# Patient Record
Sex: Female | Born: 1960 | Race: White | Hispanic: No | Marital: Married | Smoking: Never smoker
Health system: Southern US, Community
[De-identification: ages and names within clinical notes are randomized; demographics above are authoritative.]

---

## 2018-07-01 ENCOUNTER — Inpatient Hospital Stay (HOSPITAL_COMMUNITY): Payer: 59

## 2018-07-01 ENCOUNTER — Inpatient Hospital Stay (HOSPITAL_COMMUNITY): Payer: 59 | Admitting: Certified Registered Nurse Anesthetist

## 2018-07-01 ENCOUNTER — Inpatient Hospital Stay (HOSPITAL_BASED_OUTPATIENT_CLINIC_OR_DEPARTMENT_OTHER)
Admission: EM | Admit: 2018-07-01 | Discharge: 2018-07-04 | DRG: 470 | Disposition: A | Discharge status: Home Health | Payer: 59 | Attending: Internal Medicine | Admitting: Internal Medicine

## 2018-07-01 ENCOUNTER — Encounter (HOSPITAL_COMMUNITY)
Admission: EM | Disposition: A | Discharge status: Home Health | Payer: Self-pay | Source: Home / Self Care | Attending: Internal Medicine

## 2018-07-01 ENCOUNTER — Encounter (HOSPITAL_BASED_OUTPATIENT_CLINIC_OR_DEPARTMENT_OTHER): Payer: Self-pay | Admitting: *Deleted

## 2018-07-01 ENCOUNTER — Other Ambulatory Visit: Payer: Self-pay | Admitting: Physician Assistant

## 2018-07-01 ENCOUNTER — Other Ambulatory Visit: Payer: Self-pay

## 2018-07-01 ENCOUNTER — Emergency Department (HOSPITAL_BASED_OUTPATIENT_CLINIC_OR_DEPARTMENT_OTHER): Payer: 59

## 2018-07-01 DIAGNOSIS — Y9389 Activity, other specified: Secondary | ICD-10-CM | POA: Diagnosis not present

## 2018-07-01 DIAGNOSIS — W010XXA Fall on same level from slipping, tripping and stumbling without subsequent striking against object, initial encounter: Secondary | ICD-10-CM | POA: Diagnosis present

## 2018-07-01 DIAGNOSIS — Z884 Allergy status to anesthetic agent status: Secondary | ICD-10-CM | POA: Diagnosis not present

## 2018-07-01 DIAGNOSIS — Z96641 Presence of right artificial hip joint: Secondary | ICD-10-CM | POA: Diagnosis not present

## 2018-07-01 DIAGNOSIS — D72828 Other elevated white blood cell count: Secondary | ICD-10-CM | POA: Diagnosis not present

## 2018-07-01 DIAGNOSIS — S72001P Fracture of unspecified part of neck of right femur, subsequent encounter for closed fracture with malunion: Secondary | ICD-10-CM | POA: Diagnosis not present

## 2018-07-01 DIAGNOSIS — Y9289 Other specified places as the place of occurrence of the external cause: Secondary | ICD-10-CM | POA: Diagnosis not present

## 2018-07-01 DIAGNOSIS — S80211A Abrasion, right knee, initial encounter: Secondary | ICD-10-CM | POA: Diagnosis present

## 2018-07-01 DIAGNOSIS — Z20828 Contact with and (suspected) exposure to other viral communicable diseases: Secondary | ICD-10-CM | POA: Diagnosis present

## 2018-07-01 DIAGNOSIS — S72011A Unspecified intracapsular fracture of right femur, initial encounter for closed fracture: Secondary | ICD-10-CM | POA: Diagnosis present

## 2018-07-01 DIAGNOSIS — R110 Nausea: Secondary | ICD-10-CM | POA: Diagnosis not present

## 2018-07-01 DIAGNOSIS — Z885 Allergy status to narcotic agent status: Secondary | ICD-10-CM | POA: Diagnosis not present

## 2018-07-01 DIAGNOSIS — R300 Dysuria: Secondary | ICD-10-CM | POA: Diagnosis not present

## 2018-07-01 DIAGNOSIS — D509 Iron deficiency anemia, unspecified: Secondary | ICD-10-CM | POA: Diagnosis present

## 2018-07-01 DIAGNOSIS — S80212A Abrasion, left knee, initial encounter: Secondary | ICD-10-CM | POA: Diagnosis present

## 2018-07-01 DIAGNOSIS — Z419 Encounter for procedure for purposes other than remedying health state, unspecified: Secondary | ICD-10-CM

## 2018-07-01 DIAGNOSIS — R42 Dizziness and giddiness: Secondary | ICD-10-CM | POA: Diagnosis not present

## 2018-07-01 DIAGNOSIS — M25551 Pain in right hip: Secondary | ICD-10-CM | POA: Diagnosis present

## 2018-07-01 DIAGNOSIS — S72001A Fracture of unspecified part of neck of right femur, initial encounter for closed fracture: Secondary | ICD-10-CM | POA: Diagnosis not present

## 2018-07-01 DIAGNOSIS — W19XXXA Unspecified fall, initial encounter: Secondary | ICD-10-CM

## 2018-07-01 DIAGNOSIS — D62 Acute posthemorrhagic anemia: Secondary | ICD-10-CM | POA: Diagnosis not present

## 2018-07-01 HISTORY — PX: TOTAL HIP ARTHROPLASTY: SHX124

## 2018-07-01 LAB — CBC WITH DIFFERENTIAL/PLATELET
Abs Immature Granulocytes: 0.03 10*3/uL (ref 0.00–0.07)
Basophils Absolute: 0 10*3/uL (ref 0.0–0.1)
Basophils Relative: 0 %
Eosinophils Absolute: 0 10*3/uL (ref 0.0–0.5)
Eosinophils Relative: 0 %
HCT: 38.1 % (ref 36.0–46.0)
Hemoglobin: 12.1 g/dL (ref 12.0–15.0)
Immature Granulocytes: 0 %
Lymphocytes Relative: 3 %
Lymphs Abs: 0.3 10*3/uL — ABNORMAL LOW (ref 0.7–4.0)
MCH: 29.9 pg (ref 26.0–34.0)
MCHC: 31.8 g/dL (ref 30.0–36.0)
MCV: 94.1 fL (ref 80.0–100.0)
Monocytes Absolute: 0.7 10*3/uL (ref 0.1–1.0)
Monocytes Relative: 7 %
Neutro Abs: 8.8 10*3/uL — ABNORMAL HIGH (ref 1.7–7.7)
Neutrophils Relative %: 90 %
Platelets: 199 10*3/uL (ref 150–400)
RBC: 4.05 MIL/uL (ref 3.87–5.11)
RDW: 12.8 % (ref 11.5–15.5)
WBC: 9.9 10*3/uL (ref 4.0–10.5)
nRBC: 0 % (ref 0.0–0.2)

## 2018-07-01 LAB — BASIC METABOLIC PANEL
Anion gap: 11 (ref 5–15)
BUN: 16 mg/dL (ref 6–20)
CO2: 23 mmol/L (ref 22–32)
Calcium: 9.1 mg/dL (ref 8.9–10.3)
Chloride: 105 mmol/L (ref 98–111)
Creatinine, Ser: 0.63 mg/dL (ref 0.44–1.00)
GFR calc Af Amer: >60 mL/min (ref >60)
GFR calc non Af Amer: >60 mL/min (ref >60)
Glucose, Bld: 111 mg/dL — ABNORMAL HIGH (ref 70–99)
Potassium: 3.9 mmol/L (ref 3.5–5.1)
Sodium: 139 mmol/L (ref 135–145)

## 2018-07-01 LAB — ABO/RH: ABO/RH(D): A POS

## 2018-07-01 LAB — SURGICAL PCR SCREEN
MRSA, PCR: NEGATIVE
Staphylococcus aureus: NEGATIVE

## 2018-07-01 LAB — SARS CORONAVIRUS 2 BY RT PCR (HOSPITAL ORDER, PERFORMED IN ~~LOC~~ HOSPITAL LAB): SARS Coronavirus 2: NEGATIVE

## 2018-07-01 LAB — TYPE AND SCREEN
ABO/RH(D): A POS
Antibody Screen: NEGATIVE

## 2018-07-01 LAB — SARS CORONAVIRUS 2 AG (30 MIN TAT): SARS Coronavirus 2 Ag: NEGATIVE

## 2018-07-01 SURGERY — ARTHROPLASTY, HIP, TOTAL, ANTERIOR APPROACH
Anesthesia: Spinal | Laterality: Right

## 2018-07-01 MED ORDER — MIDAZOLAM HCL 2 MG/2ML IJ SOLN
INTRAMUSCULAR | Status: AC
  Filled 2018-07-01: qty 2

## 2018-07-01 MED ORDER — PROPOFOL 10 MG/ML IV BOLUS
INTRAVENOUS | Status: AC
  Filled 2018-07-01: qty 20

## 2018-07-01 MED ORDER — DOCUSATE SODIUM 100 MG PO CAPS
100.0000 mg | ORAL_CAPSULE | Freq: Two times a day (BID) | ORAL | Status: DC
  Administered 2018-07-02 – 2018-07-03 (×5): 100 mg via ORAL
  Filled 2018-07-01 (×6): qty 1

## 2018-07-01 MED ORDER — FENTANYL CITRATE (PF) 100 MCG/2ML IJ SOLN
50.0000 ug | Freq: Once | INTRAMUSCULAR | Status: DC
  Filled 2018-07-01: qty 2

## 2018-07-01 MED ORDER — ONDANSETRON HCL 4 MG/2ML IJ SOLN: 4.0000 mg | Freq: Four times a day (QID) | INTRAMUSCULAR | Status: DC | PRN

## 2018-07-01 MED ORDER — OXYCODONE HCL 5 MG/5ML PO SOLN: 5.0000 mg | Freq: Once | ORAL | Status: DC | PRN

## 2018-07-01 MED ORDER — POVIDONE-IODINE 10 % EX SWAB: 2.0000 | Freq: Once | CUTANEOUS | Status: DC

## 2018-07-01 MED ORDER — ONDANSETRON HCL 4 MG/2ML IJ SOLN
4.0000 mg | Freq: Once | INTRAMUSCULAR | Status: AC
  Administered 2018-07-01: 4 mg via INTRAVENOUS
  Filled 2018-07-01: qty 2

## 2018-07-01 MED ORDER — ONDANSETRON HCL 4 MG/2ML IJ SOLN: 4.0000 mg | Freq: Once | INTRAMUSCULAR | Status: DC | PRN

## 2018-07-01 MED ORDER — ONDANSETRON HCL 4 MG/2ML IJ SOLN
INTRAMUSCULAR | Status: DC | PRN
  Administered 2018-07-01: 4 mg via INTRAVENOUS

## 2018-07-01 MED ORDER — POVIDONE-IODINE 10 % EX SWAB
2.0000 "application " | Freq: Once | CUTANEOUS | Status: AC
  Administered 2018-07-01: 2 via TOPICAL

## 2018-07-01 MED ORDER — ONDANSETRON HCL 4 MG PO TABS: 4.0000 mg | ORAL_TABLET | Freq: Four times a day (QID) | ORAL | Status: DC | PRN

## 2018-07-01 MED ORDER — METHOCARBAMOL 500 MG PO TABS
500.0000 mg | ORAL_TABLET | Freq: Four times a day (QID) | ORAL | Status: DC | PRN
  Administered 2018-07-02 – 2018-07-03 (×3): 500 mg via ORAL
  Filled 2018-07-01 (×3): qty 1

## 2018-07-01 MED ORDER — ALUM & MAG HYDROXIDE-SIMETH 200-200-20 MG/5ML PO SUSP: 30.0000 mL | ORAL | Status: DC | PRN

## 2018-07-01 MED ORDER — CEFAZOLIN SODIUM-DEXTROSE 2-4 GM/100ML-% IV SOLN
2.0000 g | INTRAVENOUS | Status: AC
  Administered 2018-07-01: 2 g via INTRAVENOUS
  Filled 2018-07-01: qty 100

## 2018-07-01 MED ORDER — METOCLOPRAMIDE HCL 5 MG PO TABS
5.0000 mg | ORAL_TABLET | Freq: Three times a day (TID) | ORAL | Status: DC | PRN
  Filled 2018-07-01: qty 2

## 2018-07-01 MED ORDER — DIPHENHYDRAMINE HCL 12.5 MG/5ML PO ELIX: 12.5000 mg | ORAL_SOLUTION | ORAL | Status: DC | PRN

## 2018-07-01 MED ORDER — KETOROLAC TROMETHAMINE 15 MG/ML IJ SOLN
15.0000 mg | Freq: Four times a day (QID) | INTRAMUSCULAR | Status: DC | PRN
  Administered 2018-07-01 – 2018-07-04 (×2): 15 mg via INTRAVENOUS
  Filled 2018-07-01 (×3): qty 1

## 2018-07-01 MED ORDER — ONDANSETRON 4 MG PO TBDP
4.0000 mg | ORAL_TABLET | Freq: Once | ORAL | Status: DC
  Filled 2018-07-01: qty 1

## 2018-07-01 MED ORDER — TRANEXAMIC ACID-NACL 1000-0.7 MG/100ML-% IV SOLN
1000.0000 mg | INTRAVENOUS | Status: AC
  Administered 2018-07-01: 1000 mg via INTRAVENOUS
  Filled 2018-07-01: qty 100

## 2018-07-01 MED ORDER — PROPOFOL 10 MG/ML IV BOLUS
INTRAVENOUS | Status: AC
  Filled 2018-07-01: qty 40

## 2018-07-01 MED ORDER — CHLORHEXIDINE GLUCONATE 4 % EX LIQD: 60.0000 mL | Freq: Once | CUTANEOUS | Status: DC

## 2018-07-01 MED ORDER — METHOCARBAMOL 1000 MG/10ML IJ SOLN
500.0000 mg | Freq: Four times a day (QID) | INTRAVENOUS | Status: DC | PRN
  Filled 2018-07-01: qty 5

## 2018-07-01 MED ORDER — SODIUM CHLORIDE 0.9 % IR SOLN
Status: DC | PRN
  Administered 2018-07-01: 1000 mL

## 2018-07-01 MED ORDER — TRAMADOL HCL 50 MG PO TABS
100.0000 mg | ORAL_TABLET | Freq: Four times a day (QID) | ORAL | Status: DC | PRN
  Administered 2018-07-02: 100 mg via ORAL
  Filled 2018-07-01: qty 2

## 2018-07-01 MED ORDER — DEXAMETHASONE SODIUM PHOSPHATE 10 MG/ML IJ SOLN
INTRAMUSCULAR | Status: AC
  Filled 2018-07-01: qty 1

## 2018-07-01 MED ORDER — METOCLOPRAMIDE HCL 5 MG/ML IJ SOLN: 5.0000 mg | Freq: Three times a day (TID) | INTRAMUSCULAR | Status: DC | PRN

## 2018-07-01 MED ORDER — FENTANYL CITRATE (PF) 100 MCG/2ML IJ SOLN: 25.0000 ug | INTRAMUSCULAR | Status: DC | PRN

## 2018-07-01 MED ORDER — OXYCODONE HCL 5 MG PO TABS
5.0000 mg | ORAL_TABLET | ORAL | Status: DC | PRN
  Filled 2018-07-01: qty 1

## 2018-07-01 MED ORDER — DEXAMETHASONE SODIUM PHOSPHATE 10 MG/ML IJ SOLN
INTRAMUSCULAR | Status: DC | PRN
  Administered 2018-07-01: 5 mg via INTRAVENOUS

## 2018-07-01 MED ORDER — LACTATED RINGERS IV SOLN
INTRAVENOUS | Status: DC
  Administered 2018-07-01 (×2): via INTRAVENOUS

## 2018-07-01 MED ORDER — OXYCODONE HCL 5 MG PO TABS: 5.0000 mg | ORAL_TABLET | Freq: Once | ORAL | Status: DC | PRN

## 2018-07-01 MED ORDER — ASPIRIN 81 MG PO CHEW
81.0000 mg | CHEWABLE_TABLET | Freq: Two times a day (BID) | ORAL | Status: DC
  Administered 2018-07-02 – 2018-07-04 (×6): 81 mg via ORAL
  Filled 2018-07-01 (×6): qty 1

## 2018-07-01 MED ORDER — PHENOL 1.4 % MT LIQD: 1.0000 | OROMUCOSAL | Status: DC | PRN

## 2018-07-01 MED ORDER — METHOCARBAMOL 500 MG IVPB - SIMPLE MED
INTRAVENOUS | Status: AC
  Administered 2018-07-01: 500 mg
  Filled 2018-07-01: qty 50

## 2018-07-01 MED ORDER — ENOXAPARIN SODIUM 40 MG/0.4ML ~~LOC~~ SOLN: 40.0000 mg | SUBCUTANEOUS | Status: DC

## 2018-07-01 MED ORDER — POLYETHYLENE GLYCOL 3350 17 G PO PACK: 17.0000 g | PACK | Freq: Every day | ORAL | Status: DC | PRN

## 2018-07-01 MED ORDER — ONDANSETRON HCL 4 MG/2ML IJ SOLN
INTRAMUSCULAR | Status: AC
  Filled 2018-07-01: qty 2

## 2018-07-01 MED ORDER — ACETAMINOPHEN 325 MG PO TABS
325.0000 mg | ORAL_TABLET | Freq: Four times a day (QID) | ORAL | Status: DC | PRN
  Administered 2018-07-04: 650 mg via ORAL
  Filled 2018-07-01: qty 2

## 2018-07-01 MED ORDER — MIDAZOLAM HCL 5 MG/5ML IJ SOLN
INTRAMUSCULAR | Status: DC | PRN
  Administered 2018-07-01: 2 mg via INTRAVENOUS

## 2018-07-01 MED ORDER — PROPOFOL 10 MG/ML IV BOLUS
INTRAVENOUS | Status: DC | PRN
  Administered 2018-07-01: 20 mg via INTRAVENOUS
  Administered 2018-07-01: 10 mg via INTRAVENOUS
  Administered 2018-07-01: 20 mg via INTRAVENOUS

## 2018-07-01 MED ORDER — FENTANYL CITRATE (PF) 100 MCG/2ML IJ SOLN: 100.0000 ug | Freq: Once | INTRAMUSCULAR | Status: DC

## 2018-07-01 MED ORDER — CEFAZOLIN SODIUM-DEXTROSE 1-4 GM/50ML-% IV SOLN
1.0000 g | Freq: Four times a day (QID) | INTRAVENOUS | Status: AC
  Administered 2018-07-02 (×2): 1 g via INTRAVENOUS
  Filled 2018-07-01 (×2): qty 50

## 2018-07-01 MED ORDER — GABAPENTIN 100 MG PO CAPS
100.0000 mg | ORAL_CAPSULE | Freq: Three times a day (TID) | ORAL | Status: DC
  Administered 2018-07-02 – 2018-07-04 (×8): 100 mg via ORAL
  Filled 2018-07-01 (×8): qty 1

## 2018-07-01 MED ORDER — OXYCODONE HCL 5 MG PO TABS: 10.0000 mg | ORAL_TABLET | ORAL | Status: DC | PRN

## 2018-07-01 MED ORDER — PROPOFOL 500 MG/50ML IV EMUL
INTRAVENOUS | Status: DC | PRN
  Administered 2018-07-01: 75 ug/kg/min via INTRAVENOUS

## 2018-07-01 MED ORDER — SODIUM CHLORIDE 0.9 % IV SOLN
INTRAVENOUS | Status: DC
  Administered 2018-07-01: 18:00:00 via INTRAVENOUS

## 2018-07-01 MED ORDER — BUPIVACAINE HCL (PF) 0.5 % IJ SOLN
INTRAMUSCULAR | Status: DC | PRN
  Administered 2018-07-01: 3 mL via INTRATHECAL

## 2018-07-01 MED ORDER — MENTHOL 3 MG MT LOZG: 1.0000 | LOZENGE | OROMUCOSAL | Status: DC | PRN

## 2018-07-01 MED ORDER — FENTANYL CITRATE (PF) 100 MCG/2ML IJ SOLN
INTRAMUSCULAR | Status: AC
  Filled 2018-07-01: qty 2

## 2018-07-01 MED ORDER — HYDROMORPHONE HCL 1 MG/ML IJ SOLN: 0.5000 mg | INTRAMUSCULAR | Status: DC | PRN

## 2018-07-01 MED ORDER — PANTOPRAZOLE SODIUM 40 MG PO TBEC
40.0000 mg | DELAYED_RELEASE_TABLET | Freq: Every day | ORAL | Status: DC
  Administered 2018-07-02 – 2018-07-03 (×2): 40 mg via ORAL
  Filled 2018-07-01 (×3): qty 1

## 2018-07-01 MED ORDER — SODIUM CHLORIDE 0.9 % IV SOLN
INTRAVENOUS | Status: DC
  Administered 2018-07-02: via INTRAVENOUS

## 2018-07-01 SURGICAL SUPPLY — 40 items
BAG ZIPLOCK 12X15 (MISCELLANEOUS) IMPLANT
BENZOIN TINCTURE PRP APPL 2/3 (GAUZE/BANDAGES/DRESSINGS) ×3 IMPLANT
BLADE SAW SGTL 18X1.27X75 (BLADE) ×2 IMPLANT
BLADE SAW SGTL 18X1.27X75MM (BLADE) ×1
BLADE SURG SZ10 CARB STEEL (BLADE) ×6 IMPLANT
CLOSURE WOUND 1/2 X4 (GAUZE/BANDAGES/DRESSINGS) ×1
COVER PERINEAL POST (MISCELLANEOUS) ×3 IMPLANT
COVER SURGICAL LIGHT HANDLE (MISCELLANEOUS) ×3 IMPLANT
COVER WAND RF STERILE (DRAPES) IMPLANT
CUP SECTOR GRIPTON 50MM (Cup) ×3 IMPLANT
DRAPE STERI IOBAN 125X83 (DRAPES) ×3 IMPLANT
DRAPE U-SHAPE 47X51 STRL (DRAPES) ×6 IMPLANT
DRSG AQUACEL AG ADV 3.5X10 (GAUZE/BANDAGES/DRESSINGS) ×3 IMPLANT
DURAPREP 26ML APPLICATOR (WOUND CARE) ×3 IMPLANT
ELECT REM PT RETURN 15FT ADLT (MISCELLANEOUS) ×3 IMPLANT
GAUZE XEROFORM 1X8 LF (GAUZE/BANDAGES/DRESSINGS) IMPLANT
GLOVE BIO SURGEON STRL SZ7.5 (GLOVE) ×3 IMPLANT
GLOVE BIOGEL PI IND STRL 8 (GLOVE) ×2 IMPLANT
GLOVE BIOGEL PI INDICATOR 8 (GLOVE) ×4
GLOVE ECLIPSE 8.0 STRL XLNG CF (GLOVE) ×3 IMPLANT
GOWN STRL REUS W/TWL XL LVL3 (GOWN DISPOSABLE) ×6 IMPLANT
HANDPIECE INTERPULSE COAX TIP (DISPOSABLE) ×2
HEAD FEM STD 32X+9 STRL (Hips) ×3 IMPLANT
HEAD FEMORAL 32 CERAMIC (Hips) ×3 IMPLANT
HOLDER FOLEY CATH W/STRAP (MISCELLANEOUS) ×3 IMPLANT
KIT TURNOVER KIT A (KITS) ×3 IMPLANT
LINER ACETABULAR 32X50 (Liner) ×3 IMPLANT
PACK ANTERIOR HIP CUSTOM (KITS) ×3 IMPLANT
SCREW 6.5MMX25MM (Screw) ×3 IMPLANT
SET HNDPC FAN SPRY TIP SCT (DISPOSABLE) ×1 IMPLANT
STEM FEMORAL SZ 6MM STD ACTIS (Stem) ×3 IMPLANT
STRIP CLOSURE SKIN 1/2X4 (GAUZE/BANDAGES/DRESSINGS) ×2 IMPLANT
SUT ETHIBOND NAB CT1 #1 30IN (SUTURE) ×3 IMPLANT
SUT MNCRL AB 4-0 PS2 18 (SUTURE) IMPLANT
SUT VIC AB 0 CT1 36 (SUTURE) ×3 IMPLANT
SUT VIC AB 1 CT1 36 (SUTURE) ×3 IMPLANT
SUT VIC AB 2-0 CT1 27 (SUTURE) ×4
SUT VIC AB 2-0 CT1 TAPERPNT 27 (SUTURE) ×2 IMPLANT
TRAY FOLEY MTR SLVR 16FR STAT (SET/KITS/TRAYS/PACK) ×3 IMPLANT
YANKAUER SUCT BULB TIP 10FT TU (MISCELLANEOUS) ×3 IMPLANT

## 2018-07-01 NOTE — Brief Op Note (Signed)
07/01/2018  8:15 PM  PATIENT:  Annette Boyd  58 y.o. female  PRE-OPERATIVE DIAGNOSIS:  RIGHT FEMORAL NECK FRACTURE  POST-OPERATIVE DIAGNOSIS:  RIGHT FEMORAL NECK FRACTURE  PROCEDURE:  Procedure(s): TOTAL HIP ARTHROPLASTY ANTERIOR APPROACH (Right)  SURGEON:  Surgeon(s) and Role:    Kathryne Hitch, MD - Primary  PHYSICIAN ASSISTANT: Rexene Edison, PA-C  ANESTHESIA:   spinal  EBL:  200 mL   COUNTS:  YES  DICTATION: .Other Dictation: Dictation Number [id number]  PLAN OF CARE: Admit to inpatient   PATIENT DISPOSITION:  PACU - hemodynamically stable.   Delay start of Pharmacological VTE agent (>24hrs) due to surgical blood loss or risk of bleeding: no

## 2018-07-01 NOTE — ED Notes (Signed)
ED Provider at bedside. 

## 2018-07-01 NOTE — Anesthesia Procedure Notes (Signed)
Spinal  Patient location during procedure: OR Staffing Anesthesiologist: Lucretia Kern, MD Performed: anesthesiologist  Preanesthetic Checklist Completed: patient identified, surgical consent, pre-op evaluation, timeout performed, IV checked, risks and benefits discussed and monitors and equipment checked Spinal Block Patient position: left lateral decubitus Prep: site prepped and draped and DuraPrep Patient monitoring: continuous pulse ox, blood pressure and heart rate Approach: midline Location: L3-4 Injection technique: single-shot Needle Needle type: Pencan  Needle gauge: 24 G Needle length: 9 cm Additional Notes Functioning IV was confirmed and monitors were applied. Sterile prep and drape, including hand hygiene and sterile gloves were used. The patient was positioned and the spine was prepped. The skin was anesthetized with lidocaine.  Free flow of clear CSF was obtained prior to injecting local anesthetic into the CSF. The needle was carefully withdrawn. The patient tolerated the procedure well.

## 2018-07-01 NOTE — ED Notes (Signed)
Mardella Layman, RN from [employer].  Report given to her.  Will update patient.

## 2018-07-01 NOTE — Transfer of Care (Signed)
Immediate Anesthesia Transfer of Care Note  Patient: Annette Boyd  Procedure(s) Performed: TOTAL HIP ARTHROPLASTY ANTERIOR APPROACH (Right )  Patient Location: PACU  Anesthesia Type:Spinal  Level of Consciousness: drowsy  Airway & Oxygen Therapy: Patient Spontanous Breathing and Patient connected to face mask  Post-op Assessment: Report given to RN and Post -op Vital signs reviewed and stable  Post vital signs: Reviewed and stable  Last Vitals:  Vitals Value Taken Time  BP 119/67 07/01/2018  8:36 PM  Temp    Pulse    Resp 12 07/01/2018  8:37 PM  SpO2    Vitals shown include unvalidated device data.  Last Pain:  Vitals:   07/01/18 1815  TempSrc:   PainSc: 7       Patients Stated Pain Goal: 5 (07/01/18 1815)  Complications: No apparent anesthesia complications

## 2018-07-01 NOTE — ED Notes (Signed)
Husband, Annette Boyd is made of patient's room assignment and the hospital that she will be transferred to.  Attempted to call report, accepting nurse will call me back.

## 2018-07-01 NOTE — Anesthesia Postprocedure Evaluation (Signed)
Anesthesia Post Note  Patient: Annette Boyd  Procedure(s) Performed: TOTAL HIP ARTHROPLASTY ANTERIOR APPROACH (Right )     Patient location during evaluation: PACU Anesthesia Type: Spinal Level of consciousness: oriented and awake and alert Pain management: pain level controlled Vital Signs Assessment: post-procedure vital signs reviewed and stable Respiratory status: spontaneous breathing, respiratory function stable and nonlabored ventilation Cardiovascular status: blood pressure returned to baseline and stable Postop Assessment: no headache, no backache, no apparent nausea or vomiting and spinal receding Anesthetic complications: no    Last Vitals:  Vitals:   07/01/18 2115 07/01/18 2130  BP: (!) 145/76 (!) 150/78  Pulse: 77 74  Resp: (!) 8 14  Temp:    SpO2: 100% 100%    Last Pain:  Vitals:   07/01/18 2130  TempSrc:   PainSc: 0-No pain                 Lucretia Kern

## 2018-07-01 NOTE — H&P (Signed)
History and Physical    Lincoln Shew OVP:[id number] DOB: [birth year] DOA: 07/01/2018  PCP: Patient, No Pcp Per Patient coming from: Home  Chief Complaint: Right hip pain  HPI: Annette Boyd is a 58 y.o. female with no medical history. Around 4:00PM on 5/26, patient reports tripping over the door landing with furnature in her hands and fell onto her right hip. Patient took 45 minutes for her to get up and at that time declined transfer to the hospital. Unfortunately, pain worsened overnight and she was not able to ambulate secondary to significant pain.  ED Course: Vitals: Afebrile, normal pulse, normal respirations, slightly elevated BP of 146/69, on room air Labs: Glucose of 111 Imaging: Chest x-ray unremarkable Medications/Course: Zofran given  Review of Systems: Review of Systems  Musculoskeletal: Positive for joint pain (right hip).  All other systems reviewed and are negative.   History reviewed. No pertinent past medical history.  Past Surgical History:  Procedure Laterality Date  . CESAREAN SECTION     x 3     reports that she has never smoked. She has never used smokeless tobacco. She reports current alcohol use of about 1.0 standard drinks of alcohol per week. She reports that she does not use drugs.  Allergies  Allergen Reactions  . Anesthesia S-I-60 Nausea And Vomiting    Vomits for several hours.  Any Opiates makes her vomits  . Morphine And Related Nausea And Vomiting    Family History  Problem Relation Age of Onset  . Heart disease Neg Hx   . Lung disease Neg Hx    Prior to Admission medications   Medication Sig Start Date End Date Taking? Authorizing Provider  Multiple Vitamins-Minerals (MULTIVITAMIN WITH MINERALS) tablet Take 1 tablet by mouth daily.   Yes [provider]    Physical Exam:  Physical Exam Constitutional:      General: She is not in acute distress.    Appearance: She is well-developed. She is not diaphoretic.  Eyes:   Conjunctiva/sclera: Conjunctivae normal.     Pupils: Pupils are equal, round, and reactive to light.  Neck:     Musculoskeletal: Normal range of motion.  Cardiovascular:     Rate and Rhythm: Normal rate and regular rhythm.     Heart sounds: Normal heart sounds. No murmur.  Pulmonary:     Effort: Pulmonary effort is normal. No respiratory distress.     Breath sounds: Normal breath sounds. No wheezing or rales.  Abdominal:     General: Bowel sounds are normal. There is no distension.     Palpations: Abdomen is soft.     Tenderness: There is no abdominal tenderness. There is no guarding or rebound.  Musculoskeletal:        General: No tenderness.     Right hip: She exhibits decreased range of motion.  Lymphadenopathy:     Cervical: No cervical adenopathy.  Skin:    General: Skin is warm and dry.  Neurological:     Mental Status: She is alert and oriented to person, place, and time.      Labs on Admission: I have personally reviewed following labs and imaging studies  CBC: Recent Labs  Lab 07/01/18 1311  WBC 9.9  NEUTROABS 8.8*  HGB 12.1  HCT 38.1  MCV 94.1  PLT 199    Basic Metabolic Panel: Recent Labs  Lab 07/01/18 1311  NA 139  K 3.9  CL 105  CO2 23  GLUCOSE 111*  BUN 16  CREATININE 0.63  CALCIUM 9.1    GFR: Estimated Creatinine Clearance: 70 mL/min (by C-G formula based on SCr of 0.63 mg/dL).  Radiological Exams on Admission: Dg Chest 1 View  Result Date: 07/01/2018 CLINICAL DATA:  Right hip fracture. EXAM: CHEST  1 VIEW COMPARISON:  None. FINDINGS: The heart size and mediastinal contours are within normal limits. Both lungs are clear. The visualized skeletal structures are unremarkable. IMPRESSION: No active disease. Electronically Signed   By: Lupita RaiderJames  Green Jr M.D.   On: 07/01/2018 13:01   Dg Hip Unilat W Or Wo Pelvis 2-3 Views Right  Result Date: 07/01/2018 CLINICAL DATA:  Right hip pain after fall yesterday. EXAM: DG HIP (WITH OR WITHOUT PELVIS)  2-3V RIGHT COMPARISON:  None. FINDINGS: Moderately displaced fracture is seen involving the proximal right femoral neck. No significant degenerative changes noted. Left hip appears normal. IMPRESSION: Moderately displaced proximal right femoral neck fracture. Electronically Signed   By: Lupita RaiderJames  Green Jr M.D.   On: 07/01/2018 13:01    EKG: Independently reviewed. Normal sinus rhythm. Likely right atrial enlargement. Non-specific t-wave changes in leads 1 and aVL.  Assessment/Plan Active Problems:   Closed right hip fracture (HCC)   Closed right hip fracture Secondary to fall after tripping. Orthopedic surgery planning for surgery this evening. Low risk for cardiac complications. -Toradol prn -NPO  DVT prophylaxis: Lovenox Code Status: Full code Family Communication: None Disposition Plan: Home vs SNF pending PT and orthopedic surgery recommendation after surgery Consults called: Orthopedic surgery Admission status: Inpatient   Jacquelin Hawkingalph Brendi Mccarroll, MD [facility] 07/01/2018, 4:04 PM  If 7PM-7AM, please contact night-coverage [email or web address] Password [email or web address]

## 2018-07-01 NOTE — Progress Notes (Signed)
Patient ID: Annette Boyd, female   DOB: 02/03/1961, 58 y.o.   MRN: [id number] I was called about this patient being the orthopedic surgeon on call.  I did review the films and it does show that she has a significant right hip femoral neck fracture with displacement.  The recommended treatment for this type of injury would be a total hip arthroplasty (replacement).  This will be done through direct anterior approach.  Per report, she is an otherwise healthy 58 year old female and this occurred her mechanical fall.  Her labs are pending.  She will be sent to [facility] in anticipation of surgery potentially later today or this evening depending on OR availability.  It would be nice to have medical clearance for surgery but I am also fine with admitting her if there is any pushback.  I will hopefully see her when she is brought over to [facility].

## 2018-07-01 NOTE — ED Triage Notes (Signed)
Larey Seat on a concrete driveway yesterday while moving furniture.

## 2018-07-01 NOTE — ED Provider Notes (Addendum)
[facility] EMERGENCY DEPARTMENT Provider Note   CSN: [id number] Arrival date & time: 07/01/18  1153    History   Chief Complaint Chief Complaint  Patient presents with  . Hip pain    HPI Annette Boyd is a 58 y.o. female who presents for evaluation of right hip pain that began after mechanical fall that occurred yesterday.  Patient reports that she was moving furniture and states that her feet got tripped up, causing her to fall and land on her right hip.  She denies any preceding chest pain or dizziness.  She does report that she hit her head but states that she did not have any LOC.  She is not on blood thinners.  Patient states that she has not been able to ambulate or bear weight on the leg since the incident.  She states that she has been moving around in an office chair at home to get around.  Patient states she has been taking some of tramadol that her husband had previously with minimal improvement in pain.  Patient states that the pain continued today, prompting ED visit.  She states pain is worse with movement.  She denies any numbness/weakness of the leg.  Patient denies any vision changes, neck pain, back pain, chest pain, difficulty breathing, abdominal pain, nausea/vomiting.      The history is provided by the patient.    History reviewed. No pertinent past medical history.  Patient Active Problem List   Diagnosis Date Noted  . Closed right hip fracture (HCC) 07/01/2018    Past Surgical History:  Procedure Laterality Date  . CESAREAN SECTION     x 3     OB History    Gravida  3   Para      Term      Preterm      AB      Living        SAB      TAB      Ectopic      Multiple      Live Births               Home Medications    Prior to Admission medications   Not on File    Family History History reviewed. No pertinent family history.  Social History Social History   Tobacco Use  . Smoking status: Never Smoker  .  Smokeless tobacco: Never Used  Substance Use Topics  . Alcohol use: Yes    Alcohol/week: 1.0 standard drinks    Types: 1 Glasses of wine per week    Comment: per day  . Drug use: Never     Allergies   Anesthesia s-i-60   Review of Systems Review of Systems  Eyes: Negative for visual disturbance.  Respiratory: Negative for shortness of breath.   Cardiovascular: Negative for chest pain.  Gastrointestinal: Negative for abdominal pain, nausea and vomiting.  Musculoskeletal: Negative for back pain and neck pain.       Hip Pain  Neurological: Negative for weakness and numbness.  All other systems reviewed and are negative.    Physical Exam Updated Vital Signs BP (!) 166/82 (BP Location: Right Arm)   Pulse 86   Temp 98.6 F (37 C) (Oral)   Resp 16   Ht  (1.676 m)   Wt 68 kg   SpO2 98%   BMI 24.21 kg/m   Physical Exam Vitals signs and nursing note reviewed.  Constitutional:  Appearance: Normal appearance. She is well-developed.  HENT:     Head: Normocephalic and atraumatic.     Comments: No tenderness to palpation of skull. No deformities or crepitus noted. No open wounds, abrasions or lacerations.  Eyes:     General: Lids are normal.     Conjunctiva/sclera: Conjunctivae normal.     Pupils: Pupils are equal, round, and reactive to light.     Comments: PERRL. EOMs intact.   Neck:     Musculoskeletal: Full passive range of motion without pain.     Comments: Full flexion/extension and lateral movement of neck fully intact. No bony midline tenderness. No deformities or crepitus.  Cardiovascular:     Rate and Rhythm: Normal rate and regular rhythm.     Pulses: Normal pulses.          Radial pulses are 2+ on the right side and 2+ on the left side.       Dorsalis pedis pulses are 2+ on the right side and 2+ on the left side.     Heart sounds: Normal heart sounds. No murmur. No friction rub. No gallop.   Pulmonary:     Effort: Pulmonary effort is normal.      Breath sounds: Normal breath sounds.     Comments: Lungs clear to auscultation bilaterally.  Symmetric chest rise.  No wheezing, rales, rhonchi. Abdominal:     Palpations: Abdomen is soft. Abdomen is not rigid.     Tenderness: There is no abdominal tenderness. There is no guarding.     Comments: Abdomen is soft, non-distended, non-tender. No rigidity, No guarding. No peritoneal signs.  Musculoskeletal: Normal range of motion.     Comments: Tenderness palpation noted to the right inguinal fold.  No deformity or crepitus noted.  Extension intact but limited secondary to pain.  Limited flexion/internal and external rotation secondary to pain.  No tenderness palpation noted to right knee, right tib-fib, right ankle.  No tenderness palpation in left lower extremity.  Full range of motion of left lower extremity intact without any difficulty. No tenderness to palpation to bilateral shoulders, clavicles, elbows, and wrists. No deformities or crepitus noted. FROM of BUE without difficulty.   Skin:    General: Skin is warm and dry.     Capillary Refill: Capillary refill takes less than 2 seconds.     Comments: Good distal cap refill.  LLE is not dusky in appearance or cool to touch.  Neurological:     Mental Status: She is alert and oriented to person, place, and time.     Comments: Cranial nerves III-XII intact Follows commands, Moves all extremities  5/5 strength to BUE and LLE.  Limited motion of left lower extremity secondary to pain. Sensation intact throughout all major nerve distributions Normal coordination  No slurred speech. No facial droop.   Psychiatric:        Speech: Speech normal.      ED Treatments / Results  Labs (all labs ordered are listed, but only abnormal results are displayed) Labs Reviewed  BASIC METABOLIC PANEL - Abnormal; Notable for the following components:      Result Value   Glucose, Bld 111 (*)    All other components within normal limits  CBC WITH  DIFFERENTIAL/PLATELET - Abnormal; Notable for the following components:   Neutro Abs 8.8 (*)    Lymphs Abs 0.3 (*)    All other components within normal limits  SARS CORONAVIRUS 2 (HOSP ORDER, PERFORMED IN [facility]  VIA ABBOTT ID)    EKG EKG Interpretation  Date/Time:  Wednesday Jul 01 2018 13:58:45 EDT Ventricular Rate:  74 PR Interval:    QRS Duration: 103 QT Interval:  409 QTC Calculation: 454 R Axis:   87 Text Interpretation:  Sinus rhythm Right atrial enlargement Borderline right axis deviation Nonspecific T abnormalities, lateral leads No previous tracing Confirmed by Gwyneth SproutPlunkett, Whitney ([id number]) on 07/01/2018 2:01:44 PM   Radiology Dg Chest 1 View  Result Date: 07/01/2018 CLINICAL DATA:  Right hip fracture. EXAM: CHEST  1 VIEW COMPARISON:  None. FINDINGS: The heart size and mediastinal contours are within normal limits. Both lungs are clear. The visualized skeletal structures are unremarkable. IMPRESSION: No active disease. Electronically Signed   By: Lupita RaiderJames  Green Jr M.D.   On: 07/01/2018 13:01   Dg Hip Unilat W Or Wo Pelvis 2-3 Views Right  Result Date: 07/01/2018 CLINICAL DATA:  Right hip pain after fall yesterday. EXAM: DG HIP (WITH OR WITHOUT PELVIS) 2-3V RIGHT COMPARISON:  None. FINDINGS: Moderately displaced fracture is seen involving the proximal right femoral neck. No significant degenerative changes noted. Left hip appears normal. IMPRESSION: Moderately displaced proximal right femoral neck fracture. Electronically Signed   By: Lupita RaiderJames  Green Jr M.D.   On: 07/01/2018 13:01    Procedures Procedures (including critical care time)  Medications Ordered in ED Medications  ondansetron (ZOFRAN) injection 4 mg (4 mg Intravenous Given 07/01/18 1316)     Initial Impression / Assessment and Plan / ED Course  I have reviewed the triage vital signs and the nursing notes.  Pertinent labs & imaging results that were available during my care of the patient were reviewed by me  and considered in my medical decision making (see chart for details).        58 year old female who presents for evaluation of right hip pain after mechanical fall that occurred yesterday. No LOC.  Has not been able to ambulate or bear weight since the incident.  No numbness/weakness. Patient is afebrile, non-toxic appearing, sitting comfortably on examination table. Vital signs reviewed and stable. Patient is neurovascularly intact.  Patient with pain limited range of motion of right hip secondary to pain.  Concern for musculoskeletal injury versus fracture.  Plan for x-ray imaging.  No concern for head injury.  No indication for imaging of head.  XR shows moderately displaced right femoral neck fracture.  Updated patient on plan. She has not had anything to eat since yesterday. Will plan to consult orthopedics.   Discussed patient with Dr. Magnus IvanBlackman (Ortho). [facility] Plan to keep patient NPO.   CBC shows no evidence of anemia or leukocytosis. BMP is unremarkable.  Discussed patient with Dr. Caleb PoppNettey (hospitalist). Will accept patient for admission.   Portions of this note were generated with Scientist, clinical (histocompatibility and immunogenetics)Dragon dictation software. Dictation errors may occur despite best attempts at proofreading.    Final Clinical Impressions(s) / ED Diagnoses   Final diagnoses:  Displaced fracture of right femoral neck [facility](HCC)    ED Discharge Orders    None       Maxwell CaulLayden, Lindsey A, PA-C 07/01/18 1403    Maxwell CaulLayden, Lindsey A, PA-C 07/01/18 1404    Gwyneth SproutPlunkett, Whitney, MD 07/01/18 1406

## 2018-07-01 NOTE — Anesthesia Preprocedure Evaluation (Addendum)
Anesthesia Evaluation  Patient identified by MRN, date of birth, ID band Patient awake    Reviewed: Allergy & Precautions, NPO status , Patient's Chart, lab work & pertinent test results  History of Anesthesia Complications Negative for: history of anesthetic complications  Airway Mallampati: III  TM Distance: >3 FB Neck ROM: Full  Mouth opening: Limited Mouth Opening  Dental no notable dental hx.    Pulmonary neg pulmonary ROS,    Pulmonary exam normal        Cardiovascular negative cardio ROS Normal cardiovascular exam     Neuro/Psych negative neurological ROS  negative psych ROS   GI/Hepatic negative GI ROS, Neg liver ROS,   Endo/Other  negative endocrine ROS  Renal/GU negative Renal ROS  negative genitourinary   Musculoskeletal negative musculoskeletal ROS (+)   Abdominal   Peds  Hematology negative hematology ROS (+)   Anesthesia Other Findings   Reproductive/Obstetrics                            Anesthesia Physical Anesthesia Plan  ASA: I and emergent  Anesthesia Plan: Spinal   Post-op Pain Management:    Induction:   PONV Risk Score and Plan: 2 and Propofol infusion and Treatment may vary due to age or medical condition  Airway Management Planned: Natural Airway and Simple Face Mask  Additional Equipment: None  Intra-op Plan:   Post-operative Plan:   Informed Consent: I have reviewed the patients History and Physical, chart, labs and discussed the procedure including the risks, benefits and alternatives for the proposed anesthesia with the patient or authorized representative who has indicated his/her understanding and acceptance.       Plan Discussed with:   Anesthesia Plan Comments:        Anesthesia Quick Evaluation

## 2018-07-01 NOTE — Consult Note (Signed)
Reason for Consult:Right femoral neck fracture Referring Physician: [name] is an 58 y.o. female.  HPI: Patient 58 year old female who had a mechanical fall while moving some furniture yesterday.  She states she got tripped up on the furniture and fell on her right hip.  She is unable to bear weight on the leg.  She denies any loss of consciousness or dizziness.  She denies any other injury outside of some minor scrapes to both knees.  She reports she takes no chronic medications.  She actually does not do well with any type of medication and only able to take tramadol.  She also does not do well with NSAIDs due to GI upset.  She has had no history of DVT PE.  Last meal was 12:00 yesterday.  She has been N.P.O. since that time.  History reviewed. No pertinent past medical history.  Past Surgical History:  Procedure Laterality Date  . CESAREAN SECTION     x 3    History reviewed. No pertinent family history.  Social History:  reports that she has never smoked. She has never used smokeless tobacco. She reports current alcohol use of about 1.0 standard drinks of alcohol per week. She reports that she does not use drugs.  Allergies:  Allergies  Allergen Reactions  . Anesthesia S-I-60 Nausea And Vomiting    Vomits for several hours.  Any Opiates makes her vomits  . Morphine And Related Nausea And Vomiting    Medications: I have reviewed the patient's current medications.  Results for orders placed or performed during the hospital encounter of 07/01/18 (from the past 48 hour(s))  Basic metabolic panel     Status: Abnormal   Collection Time: 07/01/18  1:11 PM  Result Value Ref Range   Sodium 139 135 - 145 mmol/L   Potassium 3.9 3.5 - 5.1 mmol/L   Chloride 105 98 - 111 mmol/L   CO2 23 22 - 32 mmol/L   Glucose, Bld 111 (H) 70 - 99 mg/dL   BUN 16 6 - 20 mg/dL   Creatinine, Ser 9.32 0.44 - 1.00 mg/dL   Calcium 9.1 8.9 - 35.5 mg/dL   GFR calc non Af Amer >60 >60 mL/min   GFR  calc Af Amer >60 >60 mL/min   Anion gap 11 5 - 15    Comment: Performed at [facility], [address] [place], [place] [address]  CBC with Differential     Status: Abnormal   Collection Time: 07/01/18  1:11 PM  Result Value Ref Range   WBC 9.9 4.0 - 10.5 K/uL   RBC 4.05 3.87 - 5.11 MIL/uL   Hemoglobin 12.1 12.0 - 15.0 g/dL   HCT 25.4 27.0 - 62.3 %   MCV 94.1 80.0 - 100.0 fL   MCH 29.9 26.0 - 34.0 pg   MCHC 31.8 30.0 - 36.0 g/dL   RDW 76.2 83.1 - 51.7 %   Platelets 199 150 - 400 K/uL   nRBC 0.0 0.0 - 0.2 %   Neutrophils Relative % 90 %   Neutro Abs 8.8 (H) 1.7 - 7.7 K/uL   Lymphocytes Relative 3 %   Lymphs Abs 0.3 (L) 0.7 - 4.0 K/uL   Monocytes Relative 7 %   Monocytes Absolute 0.7 0.1 - 1.0 K/uL   Eosinophils Relative 0 %   Eosinophils Absolute 0.0 0.0 - 0.5 K/uL   Basophils Relative 0 %   Basophils Absolute 0.0 0.0 - 0.1 K/uL   Immature  Granulocytes 0 %   Abs Immature Granulocytes 0.03 0.00 - 0.07 K/uL    Comment: Performed at [facility] [place], [address] [place], [place] [address]  SARS Coronavirus 2 (Hosp order,Performed in [facility] lab via [employer])     Status: None   Collection Time: 07/01/18  1:40 PM  Result Value Ref Range   SARS Coronavirus 2 ([employer] Now) NEGATIVE NEGATIVE    Comment: (NOTE) Interpretive Result Comment(s): COVID 19 Positive SARS CoV 2 target nucleic acids are DETECTED. The SARS CoV 2 RNA is generally detectable in upper and lower respiratory specimens during the acute phase of infection.  Positive results are indicative of active infection with SARS CoV 2.  Clinical correlation with patient history and other diagnostic information is necessary to determine patient infection status.  Positive results do not rule out bacterial infection or coinfection with other viruses. The expected result is Negative. COVID 19 Negative SARS CoV 2 target nucleic acids are NOT DETECTED. The SARS CoV 2 RNA is generally detectable in  upper and lower respiratory specimens during the acute phase of infection.  Negative results do not preclude SARS CoV 2 infection, do not rule out coinfections with other pathogens, and should not be used as the sole basis for treatment or other patient management decisions.  Negative results must be combined with clinical  observations, patient history, and epidemiological information. The expected result is Negative. Invalid Presence or absence of SARS CoV 2 nucleic acids cannot be determined. Repeat testing was performed on the submitted specimen and repeated Invalid results were obtained.  If clinically indicated, additional testing on a new specimen with an alternate test methodology [phone] is advised.  The SARS CoV 2 RNA is generally detectable in upper and lower respiratory specimens during the acute phase of infection. The expected result is Negative. Fact Sheet for Patients:  http://www.graves-ford.org/https://www.fda.gov/media/136524/download Fact Sheet for Healthcare Providers: EnviroConcern.sihttps://www.fda.gov/media/136523/download This test is not yet approved or cleared by the [place] FDA and has been authorized for detection and/or diagnosis of SARS CoV 2 by FDA under an Emergency Use Authorization (EUA).  This EUA will remain in effect (meaning this test can be used) for the duration of the COVID19 d eclaration under Section 564(b)(1) of the Act, 21 U.S.C. section [phone] 3(b)(1), unless the authorization is terminated or revoked sooner. Performed at [facility] [place], [address] [place], [place] [address]     Dg Chest 1 View  Result Date: 07/01/2018 CLINICAL DATA:  Right hip fracture. EXAM: CHEST  1 VIEW COMPARISON:  None. FINDINGS: The heart size and mediastinal contours are within normal limits. Both lungs are clear. The visualized skeletal structures are unremarkable. IMPRESSION: No active disease. Electronically Signed   By: Lupita RaiderJames  Green Jr M.D.   On: 07/01/2018 13:01   Dg Hip  Unilat W Or Wo Pelvis 2-3 Views Right  Result Date: 07/01/2018 CLINICAL DATA:  Right hip pain after fall yesterday. EXAM: DG HIP (WITH OR WITHOUT PELVIS) 2-3V RIGHT COMPARISON:  None. FINDINGS: Moderately displaced fracture is seen involving the proximal right femoral neck. No significant degenerative changes noted. Left hip appears normal. IMPRESSION: Moderately displaced proximal right femoral neck fracture. Electronically Signed   By: Lupita RaiderJames  Green Jr M.D.   On: 07/01/2018 13:01    Review of Systems  Constitutional: Negative for chills and fever.  Cardiovascular: Negative for chest pain.  Musculoskeletal: Positive for joint pain.  Neurological: Negative for dizziness and loss of consciousness.  Blood pressure (!) 146/69, pulse 75, temperature 98.6 F (37 C), temperature source Oral, resp. rate 16, height 5\' 6"  (1.676 m), weight 68 kg, SpO2 100 %. Physical Exam  Constitutional: She is oriented to person, place, and time. She appears well-developed and well-nourished. No distress.  Cardiovascular: Intact distal pulses.  Respiratory: Effort normal.  Musculoskeletal:     Comments: Right hip pain with any attempts of motion. Left hip good range of motion without pain. Bilateral lower legs without pain. Small abrasions on both knees.Dorsi/plantar flexion intact bilateral ankles. Sensation intact bilateral feet.   Neurological: She is alert and oriented to person, place, and time.  Skin: Skin is warm and dry.  Psychiatric: She has a normal mood and affect.    Assessment/Plan: Right femoral neck fracture s/p mechanical fall yesterday. Plan for right total hip arthroplasty later today. Described radiographic findings with patient and need for surgical intervention.. Risk / benefits of surgery discussed.   NPO Non weight bearing right lower extremity.  Medical service to evaluate patient preoperatively for preoperative clearance. Annette Boyd 07/01/2018, 4:04 PM

## 2018-07-02 ENCOUNTER — Encounter (HOSPITAL_COMMUNITY): Payer: Self-pay | Admitting: Orthopaedic Surgery

## 2018-07-02 DIAGNOSIS — R110 Nausea: Secondary | ICD-10-CM

## 2018-07-02 DIAGNOSIS — Z96641 Presence of right artificial hip joint: Secondary | ICD-10-CM

## 2018-07-02 DIAGNOSIS — R42 Dizziness and giddiness: Secondary | ICD-10-CM

## 2018-07-02 DIAGNOSIS — S72001P Fracture of unspecified part of neck of right femur, subsequent encounter for closed fracture with malunion: Secondary | ICD-10-CM

## 2018-07-02 LAB — BASIC METABOLIC PANEL
Anion gap: 10 (ref 5–15)
BUN: 16 mg/dL (ref 6–20)
CO2: 23 mmol/L (ref 22–32)
Calcium: 8.6 mg/dL — ABNORMAL LOW (ref 8.9–10.3)
Chloride: 105 mmol/L (ref 98–111)
Creatinine, Ser: 0.69 mg/dL (ref 0.44–1.00)
GFR calc Af Amer: >60 mL/min (ref >60)
GFR calc non Af Amer: >60 mL/min (ref >60)
Glucose, Bld: 135 mg/dL — ABNORMAL HIGH (ref 70–99)
Potassium: 4.5 mmol/L (ref 3.5–5.1)
Sodium: 138 mmol/L (ref 135–145)

## 2018-07-02 LAB — URINALYSIS, ROUTINE W REFLEX MICROSCOPIC
Bilirubin Urine: NEGATIVE
Glucose, UA: NEGATIVE mg/dL
Ketones, ur: 80 mg/dL — AB
Nitrite: NEGATIVE
Protein, ur: 30 mg/dL — AB
Specific Gravity, Urine: 1.033 — ABNORMAL HIGH (ref 1.005–1.030)
pH: 5 (ref 5.0–8.0)

## 2018-07-02 LAB — CBC
HCT: 35.3 % — ABNORMAL LOW (ref 36.0–46.0)
Hemoglobin: 11.1 g/dL — ABNORMAL LOW (ref 12.0–15.0)
MCH: 29.9 pg (ref 26.0–34.0)
MCHC: 31.4 g/dL (ref 30.0–36.0)
MCV: 95.1 fL (ref 80.0–100.0)
Platelets: 156 10*3/uL (ref 150–400)
RBC: 3.71 MIL/uL — ABNORMAL LOW (ref 3.87–5.11)
RDW: 12.9 % (ref 11.5–15.5)
WBC: 12.1 10*3/uL — ABNORMAL HIGH (ref 4.0–10.5)
nRBC: 0 % (ref 0.0–0.2)

## 2018-07-02 LAB — HIV ANTIBODY (ROUTINE TESTING W REFLEX): HIV Screen 4th Generation wRfx: NONREACTIVE

## 2018-07-02 LAB — GLUCOSE, CAPILLARY: Glucose-Capillary: 159 mg/dL — ABNORMAL HIGH (ref 70–99)

## 2018-07-02 MED ORDER — ADULT MULTIVITAMIN W/MINERALS CH
1.0000 | ORAL_TABLET | Freq: Every day | ORAL | Status: DC
  Administered 2018-07-02 – 2018-07-04 (×3): 1 via ORAL
  Filled 2018-07-02 (×3): qty 1

## 2018-07-02 MED ORDER — ENSURE ENLIVE PO LIQD
237.0000 mL | ORAL | Status: DC
  Administered 2018-07-03: 237 mL via ORAL

## 2018-07-02 MED ORDER — TRAMADOL HCL 50 MG PO TABS
100.0000 mg | ORAL_TABLET | Freq: Four times a day (QID) | ORAL | Status: DC | PRN
  Administered 2018-07-02: 50 mg via ORAL
  Administered 2018-07-02 – 2018-07-03 (×2): 100 mg via ORAL
  Filled 2018-07-02 (×4): qty 2

## 2018-07-02 MED ORDER — BOOST / RESOURCE BREEZE PO LIQD CUSTOM
1.0000 | ORAL | Status: DC
  Administered 2018-07-03: 1 via ORAL

## 2018-07-02 MED ORDER — ASPIRIN 81 MG PO CHEW: 81.0000 mg | CHEWABLE_TABLET | Freq: Two times a day (BID) | ORAL | 0 refills | Status: AC

## 2018-07-02 MED ORDER — METHOCARBAMOL 500 MG PO TABS: 500.0000 mg | ORAL_TABLET | Freq: Four times a day (QID) | ORAL | 0 refills | Status: AC | PRN

## 2018-07-02 MED ORDER — SODIUM CHLORIDE 0.9 % IV SOLN
INTRAVENOUS | Status: AC
  Administered 2018-07-03: 02:00:00 via INTRAVENOUS

## 2018-07-02 MED ORDER — TRAMADOL HCL 50 MG PO TABS: 100.0000 mg | ORAL_TABLET | Freq: Four times a day (QID) | ORAL | 0 refills | Status: AC | PRN

## 2018-07-02 MED ORDER — SODIUM CHLORIDE 0.9 % IV SOLN
1.0000 g | INTRAVENOUS | Status: DC
  Administered 2018-07-02 – 2018-07-03 (×2): 1 g via INTRAVENOUS
  Filled 2018-07-02: qty 1
  Filled 2018-07-02: qty 10
  Filled 2018-07-02: qty 1

## 2018-07-02 NOTE — Progress Notes (Signed)
Physical Therapy Treatment Patient Details Name: Annette Boyd MRN: [id number] DOB: 03/31/1960 Today's Date: 07/02/2018    History of Present Illness Pt is a 59 y/o female whom sustained a right femoral neck fracture while moving furniure with her husband. She is now s/p Right total hip arthroplasty through direct anterior approach. Pt does not have a significant PMH.,    PT Comments    Pt very cooperative and with increased activity tolerance this pm.  Pt hopeful for dc home tomorrow.   Follow Up Recommendations  Follow surgeon's recommendation for DC plan and follow-up therapies     Equipment Recommendations  Rolling walker with 5" wheels    Recommendations for Other Services       Precautions / Restrictions Precautions Precautions: Fall Restrictions Weight Bearing Restrictions: No RLE Weight Bearing: Weight bearing as tolerated    Mobility  Bed Mobility Overal bed mobility: Needs Assistance Bed Mobility: Sit to Supine     Supine to sit: Min assist Sit to supine: Min assist   General bed mobility comments: increased time and cues for sequence with min assist for R LE managment  Transfers Overall transfer level: Needs assistance Equipment used: Rolling walker (2 wheeled) Transfers: Sit to/from Stand Sit to Stand: Min assist;Min guard         General transfer comment: VC's for initial safety and sequencing with RW.  Ambulation/Gait Ambulation/Gait assistance: Min assist;Min guard Gait Distance (Feet): 120 Feet Assistive device: Rolling walker (2 wheeled) Gait Pattern/deviations: Step-to pattern;Decreased step length - right;Decreased step length - left;Shuffle;Trunk flexed Gait velocity: decr   General Gait Details: cues for posture, sequence and position from [employer] Mobility    Modified Rankin (Stroke Patients Only)       Balance Overall balance assessment: No apparent balance deficits (not formally assessed)                                           Cognition Arousal/Alertness: Awake/alert Behavior During Therapy: WFL for tasks assessed/performed Overall Cognitive Status: Within Functional Limits for tasks assessed                                        Exercises Total Joint Exercises Ankle Circles/Pumps: AROM;Both;20 reps;Supine Quad Sets: AROM;Both;10 reps;Supine Heel Slides: AAROM;Right;Supine;20 reps Hip ABduction/ADduction: AAROM;Right;15 reps;Supine    General Comments        Pertinent Vitals/Pain Pain Assessment: 0-10 Pain Score: 4  Pain Location: R hip Pain Descriptors / Indicators: Aching;Sore Pain Intervention(s): Limited activity within patient's tolerance;Monitored during session;Premedicated before session;Ice applied    Home Living Family/patient expects to be discharged to:: Private residence Living Arrangements: Spouse/significant other Available Help at Discharge: Family Type of Home: House Home Access: Stairs to enter   Home Layout: One level Home Equipment: None      Prior Function Level of Independence: Independent          PT Goals (current goals can now be found in the care plan section) Acute Rehab PT Goals Patient Stated Goal: Go home to [facility] PT Goal Formulation: With patient Time For Goal Achievement: 07/09/18 Potential to Achieve Goals: Good Progress towards PT goals: Progressing toward goals    Frequency    7X/week  PT Plan Current plan remains appropriate    Co-evaluation PT/OT/SLP Co-Evaluation/Treatment: Yes Reason for Co-Treatment: For patient/therapist safety PT goals addressed during session: Mobility/safety with mobility OT goals addressed during session: ADL's and self-care      AM-PAC PT "6 Clicks" Mobility   Outcome Measure  Help needed turning from your back to your side while in a flat bed without using bedrails?: A Little Help needed moving from lying on your back to sitting on  the side of a flat bed without using bedrails?: A Little Help needed moving to and from a bed to a chair (including a wheelchair)?: A Little Help needed standing up from a chair using your arms (e.g., wheelchair or bedside chair)?: A Little Help needed to walk in hospital room?: A Little Help needed climbing 3-5 steps with a railing? : A Lot 6 Click Score: 17    End of Session Equipment Utilized During Treatment: Gait belt Activity Tolerance: Patient tolerated treatment well Patient left: in bed;with call bell/phone within reach Nurse Communication: Mobility status PT Visit Diagnosis: Difficulty in walking, not elsewhere classified (R26.2)     Time: [id number] PT Time Calculation (min) (ACUTE ONLY): 24 min  Charges:  $Gait Training: 23-37 mins $Therapeutic Exercise: 8-22 mins                     Mauro KaufmannHunter Taige Housman PT Acute Rehabilitation Services Pager [phone] Office [phone]    Annette Boyd 07/02/2018, 2:32 PM

## 2018-07-02 NOTE — Op Note (Signed)
NAMEMELICIA, Annette Boyd MEDICAL RECORD [id number] ACCOUNT [id number] DATE OF BIRTH:[birth year] FACILITY: [facility] LOCATION: [facility] PHYSICIAN:Erricka Falkner Aretha Parrot, MD  OPERATIVE REPORT  DATE OF PROCEDURE:  07/01/2018  PREOPERATIVE DIAGNOSIS:  Displaced right hip femoral neck fracture.  POSTOPERATIVE DIAGNOSIS:  Displaced right hip femoral neck fracture.  PROCEDURE:  Right total hip arthroplasty through direct anterior approach.  IMPLANTS:  DePuy Sector Gription acetabular component size 50, with a single screw, size 32+0 neutral polyethylene liner, size 6 Actis femoral component with standard offset, size 32+9 metal hip ball.  SURGEON:  Kathryne Hitch, MD  ASSISTANT:  Richardean Canal, PA-C.  ANESTHESIA:  Spinal.  ANTIBIOTICS:  Two grams IV Ancef.  ESTIMATED BLOOD LOSS:  200 mL.  COMPLICATIONS:  None.  INDICATIONS:  The patient is a 58 year old female who sustained a very hard mechanical fall yesterday onto her driveway when her and her husband moving furniture.  She had the inability to ambulate after that and significant hip pain but she is quite  tough lady and really felt like she could wait to see if she would just feel better.  By today, she was still having significant pain in her right hip and could not ambulate.  She was seen at [facility] and x-rays were obtained  finding  displaced femoral neck fracture.  She was then transported to [place].  Further definitive treatment.  She was admitted to the Medicine Service and cleared for surgery.  She tested COVID negative for coronavirus.  I told her about her predicament.  We  recommended a total hip arthroplasty given her young age of 58 and the fact that the femoral neck fracture was significantly displaced and over 24 hours.  We talked about her operative and nonoperative treatment modalities.  We talked about the risk of  acute blood loss anemia, nerve or vessel injury, fracture, infection, DVT, dislocation,  implant failure.  We talked about our goals being decreased pain, improved mobility and overall improved quality of life.  DESCRIPTION OF PROCEDURE:  After informed consent was obtained and appropriate right hip was marked.  She was brought to the operating room and laid on a stretcher where spinal anesthesia was successfully obtained.  A Foley catheter was placed and then  both feet had traction boots applied to them.  Next, she was placed supine on the Hana fracture table with the perineal post in place and both legs in line skeletal traction device and no traction applied.   She was placed supine on the Hana fracture table with the perineal post in place and both legs in line skeletal traction device and no traction applied.  Her right operative hip was prepped and draped with DuraPrep and sterile drapes.  A time-out was  called.  She was identified as correct patient, correct right hip.  We then made an incision just inferior and posterior to the anterior superior iliac spine and carried this slightly obliquely down the leg.  We dissected down the tensor fascia lata  muscle.  Tensor fascia was then divided longitudinally to proceed with a direct anterior approach to the hip.  We identified and cauterized circumflex vessels and identified the hip capsule.  We opened up the hip capsule in an L-type format finding a  large hemarthrosis and hematoma consistent with a femoral neck fracture and we could see the displaced femoral neck fracture.  We placed curved retractors within the capsule around the femoral remnants of the femoral neck medially and laterally and then  made  our femoral neck cut with an oscillating saw just proximal to the lesser trochanter, but distal to her fracture.  We completed this with an osteotome and placed a corkscrew guide in the femoral head and removed the femoral heads entirety.  We did  not find any evidence of pathology in terms of worrisome features of this fracture.  It did  just appear as a standard fracture.  We then cleaned the remnants of the labrum and other debris from the acetabular labrum.  I placed a bent Hohmann over the  medial acetabular rim and began reaming from a size 44 reamer in stepwise increments up to a size 49 with all reamers under direct visualization.  The last reamer was placed under direct fluoroscopy, so I could obtain my depth of reaming by inclination  and anteversion.  I then placed the real DePuy Sector Gription acetabular component, size 50, and with slight overhang,  I did place a single screw.  I then placed a 32+0 neutral polyethylene liner for that size acetabular component.  Attention was then  turned to the femur.  With the leg externally rotated to 120 degrees, extended and adducted, we were able to place a Mueller retractor medially and Hohman retractor behind the greater trochanter.  We released lateral joint capsule and used a box-cutting  osteotome to enter the femoral canal and a rongeur to lateralize.  I then began broaching using the Actis broaching system using the starter broach then a broach all the way up to the size 6 in stepwise increments.  With a size 6, we trialed a standard  offset femoral neck and a 32+1 hip ball, reduced this into the acetabulum and I felt that it was stable.  We dislocated the hip and removed the trial components.  I then placed the real Actis femoral component size 6 with standard offset and the real  32+1 hip ball and reduced this in the acetabulum.  It was definitely not stable.  I dislocated the hip easily.  I then knocked off that +1 ceramic hip ball and decided to go with a +9 metal ball.  My Nelva Bushtheory is that she had definitely medialized her and  assured her that she needed this tightening.  We placed the +9 metal hip ball and reduced this in the acetabulum and I was very pleased with range of motion, stability offset and leg length and assessed clinically and fluoroscopically.  We then irrigated   the soft tissue with normal saline solution using bulb syringe.  We were able to close her joint capsule with interrupted #1 Ethibond suture.  We then closed the tensor fascia with a running #1 Vicryl.  The deep tissue was closed with 0 Vicryl followed  by closing the subcutaneous tissue with 2-0 Vicryl and subcuticular stitch with a 4-0 Monocryl.  Steri-Strips and an Aquacel dressing was applied.    She was taken off the Hana table and taken to recovery room in stable condition.  All final counts were correct.  There were no complications noted.    Of note, Rexene EdisonGil Clark, PA-C assisted in the entire case and the assistance was crucial for facilitating all aspects of this case.  AN/NUANCE  D:07/01/2018 T:07/02/2018 JOB:[id number]

## 2018-07-02 NOTE — Progress Notes (Addendum)
Initial Nutrition Assessment  DOCUMENTATION CODES:   Not applicable  INTERVENTION:  - will order Boost Breeze once/day, each supplement provides 250 kcal and 9 grams of protein. - will order Ensure Enlive once/day, each supplement provides 350 kcal and 20 grams of protein. - will order Magic Cup with dinner meals, each supplement provides 290 kcal and 9 grams of protein. - will order daily multivitamin with minerals. - continue to encourage PO intakes.    NUTRITION DIAGNOSIS:   Increased nutrient needs related to post-op healing as evidenced by estimated needs.  GOAL:   Patient will meet greater than or equal to 90% of their needs  MONITOR:   PO intake, Supplement acceptance, Labs  REASON FOR ASSESSMENT:   Consult Hip fracture protocol  ASSESSMENT:   58 year old female who had a mechanical fall while moving furniture sustaining right hip fracture. Patient admitted seen in consultation by orthopedics and underwent right total hip arthroplasty on 5/27.  Diet advanced from NPO to Regular yesterday at 11:30 PM. Patient was only able to consume a few bites of breakfast this AM. She reports that she was feeling nauseated and did not feel up to eating. She reports that PTA her appetite was at baseline and she was able to eat without any difficulties or discomfort. Per chart review, current weight is 150 lb and weight on 01/12/18 was 156 lb indicating 6 lb weight loss (3.8% body weight) which is not significant for 5 month time frame.   Per notes: patient to likely d/c home with home health in as little as 24 hours. Patient had reported lightheadedness and nausea to MD; orthostatics were checked by RN late this AM. Plan is also for UA.   Medications reviewed; 100 mg colace BID,  Labs reviewed; Ca: 8.6 mg/dl.  IVF; NS @ 100 ml/hr.    NUTRITION - FOCUSED PHYSICAL EXAM:  completed; no muscle and no fat depletion.   Diet Order:   Diet Order            Diet regular Room service  appropriate? Yes; Fluid consistency: Thin  Diet effective now              EDUCATION NEEDS:   No education needs have been identified at this time  Skin:  Skin Assessment: Skin Integrity Issues: Skin Integrity Issues:: Incisions Incisions: 5/27  Last BM:  5/25 (PTA)  Height:   Ht Readings from Last 1 Encounters:  07/01/18 5\' 6"  (1.676 m)    Weight:   Wt Readings from Last 1 Encounters:  07/01/18 68 kg    Ideal Body Weight:  59.1 kg  BMI:  Body mass index is 24.21 kg/m.  Estimated Nutritional Needs:   Kcal:  1700-1905 kcal  Protein:  80-90 grams  Fluid:  >/= 1.8 L/day     Trenton Gammon, MS, RD, LDN, [facility] Inpatient Clinical Dietitian Pager # [phone] After hours/weekend pager # [phone]

## 2018-07-02 NOTE — Progress Notes (Signed)
PROGRESS NOTE    Annette Boyd  JXB:[id number] DOB: 12/18/1960 DOA: 07/01/2018 PCP: Patient, No Pcp Per    Brief Narrative:  Patient is 58 year old female who had a mechanical fall while moving furniture sustaining right hip fracture.  Patient admitted seen in consultation by orthopedics and underwent right total hip arthroplasty on 07/01/2018.  Assessment & Plan:   Principal Problem:   Displaced fracture of right femoral neck (HCC) Active Problems:   Closed right hip fracture (HCC)   Nausea   Lightheadedness   #1 moderately displaced proximal right femoral neck fracture Secondary to mechanical fall.  Status post right total hip arthroplasty per Dr. Magnus IvanBlackman 07/01/2018.  Patient seen by PT/OT.  Patient placed on aspirin for DVT prophylaxis per orthopedics.  Patient likely home with home health therapies in 24 hours if stable.  2.  Lightheadedness/nausea Patient with complaints of lightheadedness and nausea.  CBG obtained was 158.  Will check orthostatics.  Check a UA with cultures and sensitivities.  Place on gentle hydration for the next 24 hours.  Follow.  3.  Leukocytosis Likely reactive leukocytosis secondary to problem #1.  Patient however with some complaints of dysuria after Foley catheter was removed.  Check a UA with cultures and sensitivities.  Chest x-ray obtained on admission was negative.  Patient with no respiratory symptoms.  No need for antibiotics at this time.  Follow for now.  4.  Postop acute blood loss anemia Patient with no overt bleeding.  Follow H&H.  Transfusion threshold hemoglobin less than 7.   DVT prophylaxis: Aspirin 81 mg twice daily per orthopedics. Code Status: Full Family Communication: Updated patient.  No family at bedside. Disposition Plan: Likely home with home health therapy if clinically improved and per orthopedics hopefully tomorrow.   Consultants:   Orthopedics: Dr. Magnus IvanBlackman 07/01/2018  Procedures:   Right total hip arthroplasty  through direct anterior approach per Dr. Magnus IvanBlackman 07/01/2018  Plain films of the right hip and pelvis 07/01/2018  Antimicrobials:   None   Subjective: Patient sitting up in chair.  Patient just worked with physical therapy.  Patient complaining of some lightheadedness, nausea, some clamminess.  Patient does endorse some dysuria.  Denies any cough or shortness of breath.  No chest pain.  No overt bleeding.  Patient with complaints of dysuria.  Objective: Vitals:   07/02/18 0534 07/02/18 0952 07/02/18 1025 07/02/18 1031  BP: 139/61 119/61 135/68 129/68  Pulse: 70 70 78 81  Resp: 16  15   Temp: 98.5 F (36.9 C)  97.9 F (36.6 C)   TempSrc: Oral     SpO2: 100% 96% 98%   Weight:      Height:        Intake/Output Summary (Last 24 hours) at 07/02/2018 1041 Last data filed at 07/02/2018 1025 Gross per 24 hour  Intake 1965.57 ml  Output 850 ml  Net 1115.57 ml   Filed Weights   07/01/18 1208  Weight: 68 kg    Examination:  General exam: Appears calm and comfortable  Respiratory system: Clear to auscultation. Respiratory effort normal. Cardiovascular system: S1 & S2 heard, RRR. No JVD, murmurs, rubs, gallops or clicks. No pedal edema. Gastrointestinal system: Abdomen is nondistended, soft and nontender. No organomegaly or masses felt. Normal bowel sounds heard. Central nervous system: Alert and oriented. No focal neurological deficits. Extremities: Symmetric 5 x 5 power. Skin: No rashes, lesions or ulcers Psychiatry: Judgement and insight appear normal. Mood & affect appropriate.     Data Reviewed: I have  personally reviewed following labs and imaging studies  CBC: Recent Labs  Lab 07/01/18 1311 07/02/18 0444  WBC 9.9 12.1*  NEUTROABS 8.8*  --   HGB 12.1 11.1*  HCT 38.1 35.3*  MCV 94.1 95.1  PLT 199 156   Basic Metabolic Panel: Recent Labs  Lab 07/01/18 1311 07/02/18 0444  NA 139 138  K 3.9 4.5  CL 105 105  CO2 23 23  GLUCOSE 111* 135*  BUN 16 16   CREATININE 0.63 0.69  CALCIUM 9.1 8.6*   GFR: Estimated Creatinine Clearance: 70 mL/min (by C-G formula based on SCr of 0.69 mg/dL). Liver Function Tests: No results for input(s): AST, ALT, ALKPHOS, BILITOT, PROT, ALBUMIN in the last 168 hours. No results for input(s): LIPASE, AMYLASE in the last 168 hours. No results for input(s): AMMONIA in the last 168 hours. Coagulation Profile: No results for input(s): INR, PROTIME in the last 168 hours. Cardiac Enzymes: No results for input(s): CKTOTAL, CKMB, CKMBINDEX, TROPONINI in the last 168 hours. BNP (last 3 results) No results for input(s): PROBNP in the last 8760 hours. HbA1C: No results for input(s): HGBA1C in the last 72 hours. CBG: Recent Labs  Lab 07/02/18 1024  GLUCAP 159*   Lipid Profile: No results for input(s): CHOL, HDL, LDLCALC, TRIG, CHOLHDL, LDLDIRECT in the last 72 hours. Thyroid Function Tests: No results for input(s): TSH, T4TOTAL, FREET4, T3FREE, THYROIDAB in the last 72 hours. Anemia Panel: No results for input(s): VITAMINB12, FOLATE, FERRITIN, TIBC, IRON, RETICCTPCT in the last 72 hours. Sepsis Labs: No results for input(s): PROCALCITON, LATICACIDVEN in the last 168 hours.  Recent Results (from the past 240 hour(s))  SARS Coronavirus 2 (Hosp order,Performed in [facility] lab via [employer] ID)     Status: None   Collection Time: 07/01/18  1:40 PM  Result Value Ref Range Status   SARS Coronavirus 2 ([employer] Now) NEGATIVE NEGATIVE Final    Comment: (NOTE) Interpretive Result Comment(s): COVID 19 Positive SARS CoV 2 target nucleic acids are DETECTED. The SARS CoV 2 RNA is generally detectable in upper and lower respiratory specimens during the acute phase of infection.  Positive results are indicative of active infection with SARS CoV 2.  Clinical correlation with patient history and other diagnostic information is necessary to determine patient infection status.  Positive results do not rule out bacterial  infection or coinfection with other viruses. The expected result is Negative. COVID 19 Negative SARS CoV 2 target nucleic acids are NOT DETECTED. The SARS CoV 2 RNA is generally detectable in upper and lower respiratory specimens during the acute phase of infection.  Negative results do not preclude SARS CoV 2 infection, do not rule out coinfections with other pathogens, and should not be used as the sole basis for treatment or other patient management decisions.  Negative results must be combined with clinical  observations, patient history, and epidemiological information. The expected result is Negative. Invalid Presence or absence of SARS CoV 2 nucleic acids cannot be determined. Repeat testing was performed on the submitted specimen and repeated Invalid results were obtained.  If clinically indicated, additional testing on a new specimen with an alternate test methodology [phone]) is advised.  The SARS CoV 2 RNA is generally detectable in upper and lower respiratory specimens during the acute phase of infection. The expected result is Negative. Fact Sheet for Patients:  http://www.graves-ford.org/ Fact Sheet for Healthcare Providers: EnviroConcern.si This test is not yet approved or cleared by the [place] and has been authorized  for detection and/or diagnosis of SARS CoV 2 by FDA under an Emergency Use Authorization (EUA).  This EUA will remain in effect (meaning this test can be used) for the duration of the COVID19 d eclaration under Section 564(b)(1) of the Act, 21 U.S.C. section [phone] 3(b)(1), unless the authorization is terminated or revoked sooner. Performed at [facility], [address] [place], [place] [address]   SARS Coronavirus 2 (CEPHEID - Performed in [facility] hospital lab), Hosp Order     Status: None   Collection Time: 07/01/18  4:45 PM  Result Value Ref Range Status   SARS Coronavirus 2  NEGATIVE NEGATIVE Final    Comment: (NOTE) If result is NEGATIVE SARS-CoV-2 target nucleic acids are NOT DETECTED. The SARS-CoV-2 RNA is generally detectable in upper and lower  respiratory specimens during the acute phase of infection. The lowest  concentration of SARS-CoV-2 viral copies this assay can detect is 250  copies / mL. A negative result does not preclude SARS-CoV-2 infection  and should not be used as the sole basis for treatment or other  patient management decisions.  A negative result may occur with  improper specimen collection / handling, submission of specimen other  than nasopharyngeal swab, presence of viral mutation(s) within the  areas targeted by this assay, and inadequate number of viral copies  (<250 copies / mL). A negative result must be combined with clinical  observations, patient history, and epidemiological information. If result is POSITIVE SARS-CoV-2 target nucleic acids are DETECTED. The SARS-CoV-2 RNA is generally detectable in upper and lower  respiratory specimens dur ing the acute phase of infection.  Positive  results are indicative of active infection with SARS-CoV-2.  Clinical  correlation with patient history and other diagnostic information is  necessary to determine patient infection status.  Positive results do  not rule out bacterial infection or co-infection with other viruses. If result is PRESUMPTIVE POSTIVE SARS-CoV-2 nucleic acids MAY BE PRESENT.   A presumptive positive result was obtained on the submitted specimen  and confirmed on repeat testing.  While 2019 novel coronavirus  (SARS-CoV-2) nucleic acids may be present in the submitted sample  additional confirmatory testing may be necessary for epidemiological  and / or clinical management purposes  to differentiate between  SARS-CoV-2 and other Sarbecovirus currently known to infect humans.  If clinically indicated additional testing with an alternate test  methodology [phone])  is advised. The SARS-CoV-2 RNA is generally  detectable in upper and lower respiratory sp ecimens during the acute  phase of infection. The expected result is Negative. Fact Sheet for Patients:  BoilerBrush.com.cy Fact Sheet for Healthcare Providers: https://pope.com/ This test is not yet approved or cleared by the [place] FDA and has been authorized for detection and/or diagnosis of SARS-CoV-2 by FDA under an Emergency Use Authorization (EUA).  This EUA will remain in effect (meaning this test can be used) for the duration of the COVID-19 declaration under Section 564(b)(1) of the Act, 21 U.S.C. section 360bbb-3(b)(1), unless the authorization is terminated or revoked sooner. Performed at [facility], [address] [place], [place] [address]   Surgical pcr screen     Status: None   Collection Time: 07/01/18  4:47 PM  Result Value Ref Range Status   MRSA, PCR NEGATIVE NEGATIVE Final   Staphylococcus aureus NEGATIVE NEGATIVE Final    Comment: (NOTE) The Xpert SA Assay (FDA approved for NASAL specimens in patients 24 years of age and older), is  one component of a comprehensive surveillance program. It is not intended to diagnose infection nor to guide or monitor treatment. Performed at [facility], [address], [place], [place] [address]          Radiology Studies: Dg Chest 1 View  Result Date: 07/01/2018 CLINICAL DATA:  Right hip fracture. EXAM: CHEST  1 VIEW COMPARISON:  None. FINDINGS: The heart size and mediastinal contours are within normal limits. Both lungs are clear. The visualized skeletal structures are unremarkable. IMPRESSION: No active disease. Electronically Signed   By: Lupita Raider M.D.   On: 07/01/2018 13:01   Dg Pelvis Portable  Result Date: 07/01/2018 CLINICAL DATA:  The patient suffered a subcapital right hip fracture in a fall 06/30/2018. Status post hip  replacement today. Initial encounter. EXAM: PORTABLE PELVIS 1-2 VIEWS COMPARISON:  Plain films right hip 07/01/2018. FINDINGS: New right total hip replacement is identified. The device is located. No fracture. Gas in the soft tissues from surgery noted. Imaged bones and joints otherwise appear normal. IMPRESSION: Status post right hip replacement.  No acute abnormality. Electronically Signed   By: Drusilla Kanner M.D.   On: 07/01/2018 21:01   Dg C-arm 1-60 Min-no Report  Result Date: 07/01/2018 Fluoroscopy was utilized by the requesting physician.  No radiographic interpretation.   Dg Hip Port Unilat With Pelvis 1v Right  Result Date: 07/01/2018 CLINICAL DATA:  Right femur fracture EXAM: DG HIP (WITH OR WITHOUT PELVIS) 1V PORT RIGHT COMPARISON:  07/01/2018 FINDINGS: Seven low resolution intraoperative spot views of the right hip. Total fluoroscopy time was 24 seconds. Initial images demonstrate right femoral neck fracture. Subsequent images demonstrate right hip replacement with normal alignment. IMPRESSION: Intraoperative fluoroscopic assistance provided during right hip replacement for femoral neck fracture Electronically Signed   By: Jasmine Pang M.D.   On: 07/01/2018 20:23   Dg Hip Unilat W Or Wo Pelvis 2-3 Views Right  Result Date: 07/01/2018 CLINICAL DATA:  Right hip pain after fall yesterday. EXAM: DG HIP (WITH OR WITHOUT PELVIS) 2-3V RIGHT COMPARISON:  None. FINDINGS: Moderately displaced fracture is seen involving the proximal right femoral neck. No significant degenerative changes noted. Left hip appears normal. IMPRESSION: Moderately displaced proximal right femoral neck fracture. Electronically Signed   By: Lupita Raider M.D.   On: 07/01/2018 13:01        Scheduled Meds: . aspirin  81 mg Oral BID  . docusate sodium  100 mg Oral BID  . gabapentin  100 mg Oral TID  . pantoprazole  40 mg Oral Daily   Continuous Infusions: . sodium chloride 75 mL/hr at 07/02/18 0001  .  methocarbamol (ROBAXIN) IV       LOS: 1 day    Time spent: 35 minutes    Ramiro Harvest, MD [facility]  If 7PM-7AM, please contact night-coverage [email or web address] 07/02/2018, 10:41 AM

## 2018-07-02 NOTE — Evaluation (Signed)
Occupational Therapy Evaluation Patient Details Name: Annette Boyd MRN: [id number] DOB: 11-29-1960 Today's Date: 07/02/2018    History of Present Illness Pt is a 58 y/o female whom sustained a right femoral neck fracture while moving furniure with her husband. She is now s/p Right total hip arthroplasty through direct anterior approach. Pt does not have a significant PMH.,   Clinical Impression   Pt admitted as per above. Pt participated in OT Eval and ADL retraining session & currently requires overall Min assist at this time. She was somewhat groggy most likely due to medications and first time OOB. She will benefit from further OT treatment session for ADL retraining related to LB dressing/bathing and shower transfers as well as possible A/E needs. Will follow acutely for OT.    Follow Up Recommendations  No OT follow up;Follow surgeon's recommendation for DC plan and follow-up therapies;Supervision - Intermittent    Equipment Recommendations  3 in 1 bedside commode;Other (comment)(? A/E PRN)    Recommendations for Other Services       Precautions / Restrictions Precautions Precautions: Anterior Hip Precaution Booklet Issued: No Restrictions Weight Bearing Restrictions: Yes RLE Weight Bearing: Weight bearing as tolerated      Mobility Bed Mobility Overal bed mobility: Needs Assistance Bed Mobility: Supine to Sit     Supine to sit: Modified independent (Device/Increase time);HOB elevated     General bed mobility comments: Min guard assist RLE while scooting to EOB only.  Transfers Overall transfer level: Needs assistance Equipment used: Rolling walker (2 wheeled) Transfers: Sit to/from Stand Sit to Stand: Min assist         General transfer comment: VC's for initial safety and sequencing with RW.    Balance Overall balance assessment: No apparent balance deficits (not formally assessed)                                         ADL either  performed or assessed with clinical judgement   ADL Overall ADL's : Needs assistance/impaired Eating/Feeding: Independent;Bed level   Grooming: Wash/dry hands;Wash/dry face;Standing;Min guard   Upper Body Bathing: Set up;Sitting   Lower Body Bathing: Minimal assistance;Sit to/from stand   Upper Body Dressing : Set up;Sitting   Lower Body Dressing: Minimal assistance;Sit to/from stand   Toilet Transfer: Minimal assistance;Ambulation;Grab bars;RW Statistician Details (indicate cue type and reason): 3:1 over toilet Toileting- Clothing Manipulation and Hygiene: Minimal assistance;Sitting/lateral lean;Sit to/from stand;Cueing for safety     Tub/Shower Transfer Details (indicate cue type and reason): Pt has walk in shower at home, defer to next visit as pt w/ c/o feeling light headed after functional mobility with PT and then toileting with OT Functional mobility during ADLs: Minimal assistance;Cueing for safety;Rolling walker General ADL Comments: Pt requires overall Min assist at this time, She was somewhat groggy most likely due to medications and first time OOB. She will benefit from further OT treatment session for ADL retraining related to LB dressing/bathing and shower transfers as well as possible A/E needs. Will follow acutely for OT.     Vision Baseline Vision/History: Wears glasses Wears Glasses: At all times Patient Visual Report: No change from baseline Vision Assessment?: No apparent visual deficits     Perception     Praxis      Pertinent Vitals/Pain Pain Assessment: 0-10 Pain Score: 0-No pain(states "No pain, a little pulling at the incision") Pain Intervention(s): Ice applied;Limited activity  within patient's tolerance;Monitored during session;Premedicated before session     Hand Dominance Right   Extremity/Trunk Assessment Upper Extremity Assessment Upper Extremity Assessment: Overall WFL for tasks assessed   Lower Extremity Assessment Lower Extremity  Assessment: Defer to PT evaluation   Cervical / Trunk Assessment Cervical / Trunk Assessment: Normal   Communication Communication Communication: No difficulties   Cognition Arousal/Alertness: Awake/alert Behavior During Therapy: WFL for tasks assessed/performed Overall Cognitive Status: Within Functional Limits for tasks assessed                                     General Comments  Pt performed functional mobility and trasnfers in room and just past doorway into hall with PT/OT +1 Min Assist. Pt reported feeling "Out of it" or "A little light headed" and then ambulated back into room where she requested toileting. Pt BP was assessed after toileting and was 119/61, pulse 80, O2 SATs 96% even though pt was noted to become somewhat sweaty. She was assisted back to revliner chair Min A and a cool cloth was applied to her head and ice to R hip. RN was in room at conclusion of therapy session.    Exercises     Shoulder Instructions      Home Living Family/patient expects to be discharged to:: Private residence(Pt lives in [place] and was assisting in moving her husband from [facility] to [facility].) Living Arrangements: Spouse/significant other Available Help at Discharge: Family Type of Home: House       Home Layout: One level     Bathroom Shower/Tub: Producer, television/film/video: Standard     Home Equipment: None          Prior Functioning/Environment Level of Independence: Independent                 OT Problem List: Decreased activity tolerance;Decreased knowledge of use of DME or AE;Pain      OT Treatment/Interventions: Self-care/ADL training;DME and/or AE instruction;Therapeutic activities;Patient/family education    OT Goals(Current goals can be found in the care plan section) Acute Rehab OT Goals Patient Stated Goal: Go home to [facility] OT Goal Formulation: With patient Time For Goal Achievement: 07/16/18 Potential to Achieve Goals: Good  OT Frequency: Min  2X/week   Barriers to D/C:            Co-evaluation PT/OT/SLP Co-Evaluation/Treatment: Yes Reason for Co-Treatment: For patient/therapist safety;To address functional/ADL transfers PT goals addressed during session: Mobility/safety with mobility;Proper use of DME OT goals addressed during session: ADL's and self-care;Proper use of Adaptive equipment and DME      AM-PAC OT "6 Clicks" Daily Activity     Outcome Measure Help from another person eating meals?: None Help from another person taking care of personal grooming?: A Little Help from another person toileting, which includes using toliet, bedpan, or urinal?: A Little Help from another person bathing (including washing, rinsing, drying)?: A Little Help from another person to put on and taking off regular upper body clothing?: None Help from another person to put on and taking off regular lower body clothing?: A Little 6 Click Score: 20   End of Session Equipment Utilized During Treatment: Rolling walker Nurse Communication: Mobility status;Other (comment)(BP)  Activity Tolerance: Patient tolerated treatment well Patient left: in chair;with call bell/phone within reach;with nursing/sitter in room  OT Visit Diagnosis: Muscle weakness (generalized) (M62.81);Pain Pain - Right/Left: Right Pain - part of  body: Hip                Time: 0922-0952 OT Time Calculation (min): 30 min Charges:  OT General Charges $OT Visit: 1 Visit OT Evaluation $OT Eval Low Complexity: 1 Low OT Treatments $Self Care/Home Management : 8-22 mins   Barnhill, Amy Beth Dixon, OTR/L 07/02/2018, 10:18 AM

## 2018-07-02 NOTE — Progress Notes (Signed)
Subjective: 1 Day Post-Op Procedure(s) (LRB): TOTAL HIP ARTHROPLASTY ANTERIOR APPROACH (Right) Patient reports pain as moderate.  Tolerated her surgery well.  I shared her x-rays with her.  H/H stable.  Vitals stable.  Objective: Vital signs in last 24 hours: Temp:  [97.4 F (36.3 C)-98.6 F (37 C)] 98.5 F (36.9 C) (05/28 0534) Pulse Rate:  [55-98] 70 (05/28 0952) Resp:  [8-17] 16 (05/28 0534) BP: (118-166)/(60-82) 119/61 (05/28 0952) SpO2:  [96 %-100 %] 96 % (05/28 0952) Weight:  [68 kg] 68 kg (05/27 1208)  Intake/Output from previous day: 05/27 0701 - 05/28 0700 In: 1965.6 [P.O.:260; I.V.:1447.5; IV Piggyback:258.1] Out: 700 [Urine:500; Blood:200] Intake/Output this shift: No intake/output data recorded.  Recent Labs    07/01/18 1311 07/02/18 0444  HGB 12.1 11.1*   Recent Labs    07/01/18 1311 07/02/18 0444  WBC 9.9 12.1*  RBC 4.05 3.71*  HCT 38.1 35.3*  PLT 199 156   Recent Labs    07/01/18 1311 07/02/18 0444  NA 139 138  K 3.9 4.5  CL 105 105  CO2 23 23  BUN 16 16  CREATININE 0.63 0.69  GLUCOSE 111* 135*  CALCIUM 9.1 8.6*   No results for input(s): LABPT, INR in the last 72 hours.  Sensation intact distally Intact pulses distally Dorsiflexion/Plantar flexion intact Incision: scant drainage   Assessment/Plan: 1 Day Post-Op Procedure(s) (LRB): TOTAL HIP ARTHROPLASTY ANTERIOR APPROACH (Right) Up with therapy Plan for discharge tomorrow Discharge home with home health      Kathryne Hitch 07/02/2018, 10:05 AM

## 2018-07-02 NOTE — Evaluation (Signed)
Physical Therapy Evaluation Patient Details Name: Annette Boyd MRN: [id number] DOB: [birth year] Today's Date: 07/02/2018   History of Present Illness  Pt is a 58 y/o female whom sustained a right femoral neck fracture while moving furniure with her husband. She is now s/p Right total hip arthroplasty through direct anterior approach. Pt does not have a significant PMH.,  Clinical Impression  Pt s/p R THR and presents with decreased R LE strength/ROM and post op pain limiting functional mobility.  Pt should progress well to dc home with assist of spouse.  Follow Up Recommendations Follow surgeon's recommendation for DC plan and follow-up therapies    Equipment Recommendations  Rolling walker with 5" wheels    Recommendations for Other Services       Precautions / Restrictions Precautions Precautions: Fall Precaution Booklet Issued: No Restrictions Weight Bearing Restrictions: No RLE Weight Bearing: Weight bearing as tolerated      Mobility  Bed Mobility Overal bed mobility: Needs Assistance Bed Mobility: Supine to Sit     Supine to sit: Min assist     General bed mobility comments: increased time with min assist for R LE managment  Transfers Overall transfer level: Needs assistance Equipment used: Rolling walker (2 wheeled) Transfers: Sit to/from Stand Sit to Stand: Min assist         General transfer comment: VC's for initial safety and sequencing with RW.  Ambulation/Gait Ambulation/Gait assistance: Min assist Gait Distance (Feet): 48 Feet Assistive device: Rolling walker (2 wheeled) Gait Pattern/deviations: Step-to pattern;Decreased step length - right;Decreased step length - left;Shuffle;Trunk flexed Gait velocity: decr   General Gait Details: cues for posture, sequence and position from [employer] Mobility    Modified Rankin (Stroke Patients Only)       Balance Overall balance assessment: No apparent balance deficits (not  formally assessed)                                           Pertinent Vitals/Pain Pain Assessment: 0-10 Pain Score: 2  Pain Location: R hip Pain Descriptors / Indicators: Aching;Sore Pain Intervention(s): Limited activity within patient's tolerance;Monitored during session;Premedicated before session;Ice applied    Home Living Family/patient expects to be discharged to:: Private residence Living Arrangements: Spouse/significant other Available Help at Discharge: Family Type of Home: House Home Access: Stairs to enter   Secretary/administrator of Steps: 1 Home Layout: One level Home Equipment: None      Prior Function Level of Independence: Independent               Hand Dominance   Dominant Hand: Right    Extremity/Trunk Assessment   Upper Extremity Assessment Upper Extremity Assessment: Overall WFL for tasks assessed    Lower Extremity Assessment Lower Extremity Assessment: RLE deficits/detail RLE Deficits / Details: 2/5 strength at hip with AAROM at hip to 90 flex and 15 abd    Cervical / Trunk Assessment Cervical / Trunk Assessment: Normal  Communication   Communication: No difficulties  Cognition Arousal/Alertness: Awake/alert Behavior During Therapy: WFL for tasks assessed/performed Overall Cognitive Status: Within Functional Limits for tasks assessed  General Comments General comments (skin integrity, edema, etc.): Pt performed functional mobility and trasnfers in room and just past doorway into hall with PT/OT +1 Min Assist. Pt reported feeling "Out of it" or "A little light headed" and then ambulated back into room where she requested toileting. Pt BP was assessed after toileting and was 119/61, pulse 80, O2 SATs 96% even though pt was noted to become somewhat sweaty. She was assisted back to revliner chair Min A and a cool cloth was applied to her head and ice to R hip. RN was in  room at conclusion of therapy session.    Exercises Total Joint Exercises Ankle Circles/Pumps: AROM;Both;20 reps;Supine Quad Sets: AROM;Both;10 reps;Supine Heel Slides: AAROM;Right;Supine;20 reps Hip ABduction/ADduction: AAROM;Right;15 reps;Supine   Assessment/Plan    PT Assessment Patient needs continued PT services  PT Problem List Decreased strength;Decreased range of motion;Decreased activity tolerance;Decreased balance;Pain;Decreased knowledge of use of DME;Decreased mobility       PT Treatment Interventions DME instruction;Gait training;Stair training;Functional mobility training;Therapeutic activities;Therapeutic exercise;Patient/family education    PT Goals (Current goals can be found in the Care Plan section)  Acute Rehab PT Goals [facility] Formulation: With patient Time For Goal Achievement: 07/09/18 Potential to Achieve Goals: Good    Frequency 7X/week   Barriers to discharge        Co-evaluation PT/OT/SLP Co-Evaluation/Treatment: Yes Reason for Co-Treatment: For patient/therapist safety PT goals addressed during session: Mobility/safety with mobility OT goals addressed during session: ADL's and self-care       AM-PAC PT "6 Clicks" Mobility  Outcome Measure Help needed turning from your back to your side while in a flat bed without using bedrails?: A Little Help needed moving from lying on your back to sitting on the side of a flat bed without using bedrails?: A Little Help needed moving to and from a bed to a chair (including a wheelchair)?: A Little Help needed standing up from a chair using your arms (e.g., wheelchair or bedside chair)?: A Little Help needed to walk in hospital room?: A Little Help needed climbing 3-5 steps with a railing? : A Lot 6 Click Score: 17    End of Session Equipment Utilized During Treatment: Gait belt Activity Tolerance: Patient tolerated treatment well;Patient limited by fatigue Patient  left: Other (comment)(in bathroom with OT) Nurse Communication: Mobility status PT Visit Diagnosis: Difficulty in walking, not elsewhere classified (R26.2)    Time: [id number] PT Time Calculation (min) (ACUTE ONLY): 33 min   Charges:   PT Evaluation $PT Eval Low Complexity: 1 Low PT Treatments $Therapeutic Exercise: 8-22 mins        Mauro KaufmannHunter Danilyn Cocke PT Acute Rehabilitation Services Pager [phone] Office [phone]   [facility] Mental Health InstituteBRADSHAW,Shatiqua Heroux 07/02/2018, 12:35 PM

## 2018-07-02 NOTE — Progress Notes (Signed)
Orthostatic Vitals:     07/02/18 1025 07/02/18 1031  Vitals  Temp 97.9 F (36.6 C)  --   BP 135/68 129/68  MAP (mmHg) 87 86  BP Location Left Arm  --   BP Method Automatic Automatic  Patient Position (if appropriate) Sitting Standing  Pulse Rate 78 81  Resp 15  --   Oxygen Therapy  SpO2 98 %  --   O2 Device Room Air  --

## 2018-07-03 LAB — CBC WITH DIFFERENTIAL/PLATELET
Abs Immature Granulocytes: 0.05 10*3/uL (ref 0.00–0.07)
Basophils Absolute: 0 10*3/uL (ref 0.0–0.1)
Basophils Relative: 0 %
Eosinophils Absolute: 0.1 10*3/uL (ref 0.0–0.5)
Eosinophils Relative: 1 %
HCT: 30.8 % — ABNORMAL LOW (ref 36.0–46.0)
Hemoglobin: 9.5 g/dL — ABNORMAL LOW (ref 12.0–15.0)
Immature Granulocytes: 1 %
Lymphocytes Relative: 7 %
Lymphs Abs: 0.6 10*3/uL — ABNORMAL LOW (ref 0.7–4.0)
MCH: 29.9 pg (ref 26.0–34.0)
MCHC: 30.8 g/dL (ref 30.0–36.0)
MCV: 96.9 fL (ref 80.0–100.0)
Monocytes Absolute: 1 10*3/uL (ref 0.1–1.0)
Monocytes Relative: 12 %
Neutro Abs: 6.6 10*3/uL (ref 1.7–7.7)
Neutrophils Relative %: 79 %
Platelets: 159 10*3/uL (ref 150–400)
RBC: 3.18 MIL/uL — ABNORMAL LOW (ref 3.87–5.11)
RDW: 13.2 % (ref 11.5–15.5)
WBC: 8.2 10*3/uL (ref 4.0–10.5)
nRBC: 0 % (ref 0.0–0.2)

## 2018-07-03 LAB — BASIC METABOLIC PANEL
Anion gap: 7 (ref 5–15)
BUN: 17 mg/dL (ref 6–20)
CO2: 26 mmol/L (ref 22–32)
Calcium: 8.2 mg/dL — ABNORMAL LOW (ref 8.9–10.3)
Chloride: 105 mmol/L (ref 98–111)
Creatinine, Ser: 0.65 mg/dL (ref 0.44–1.00)
GFR calc Af Amer: >60 mL/min (ref >60)
GFR calc non Af Amer: >60 mL/min (ref >60)
Glucose, Bld: 109 mg/dL — ABNORMAL HIGH (ref 70–99)
Potassium: 3.8 mmol/L (ref 3.5–5.1)
Sodium: 138 mmol/L (ref 135–145)

## 2018-07-03 LAB — HEMOGLOBIN AND HEMATOCRIT, BLOOD
HCT: 31.1 % — ABNORMAL LOW (ref 36.0–46.0)
Hemoglobin: 9.5 g/dL — ABNORMAL LOW (ref 12.0–15.0)

## 2018-07-03 LAB — URINE CULTURE: Culture: <10000 — AB

## 2018-07-03 LAB — FOLATE: Folate: 6.1 ng/mL (ref >5.9)

## 2018-07-03 LAB — IRON AND TIBC
Iron: 9 ug/dL — ABNORMAL LOW (ref 28–170)
Saturation Ratios: 4 % — ABNORMAL LOW (ref 10.4–31.8)
TIBC: 232 ug/dL — ABNORMAL LOW (ref 250–450)
UIBC: 223 ug/dL

## 2018-07-03 LAB — FERRITIN: Ferritin: 178 ng/mL (ref 11–307)

## 2018-07-03 LAB — MAGNESIUM: Magnesium: 1.8 mg/dL (ref 1.7–2.4)

## 2018-07-03 LAB — VITAMIN B12: Vitamin B-12: 401 pg/mL (ref 180–914)

## 2018-07-03 MED ORDER — POLYSACCHARIDE IRON COMPLEX 150 MG PO CAPS
150.0000 mg | ORAL_CAPSULE | Freq: Every day | ORAL | Status: DC
  Administered 2018-07-04: 150 mg via ORAL
  Filled 2018-07-03: qty 1

## 2018-07-03 MED ORDER — IBUPROFEN 400 MG PO TABS
600.0000 mg | ORAL_TABLET | Freq: Three times a day (TID) | ORAL | Status: DC | PRN
  Administered 2018-07-03 (×2): 600 mg via ORAL
  Filled 2018-07-03 (×2): qty 1

## 2018-07-03 MED ORDER — SODIUM CHLORIDE 0.9 % IV SOLN
510.0000 mg | Freq: Once | INTRAVENOUS | Status: AC
  Administered 2018-07-03: 19:00:00 510 mg via INTRAVENOUS
  Filled 2018-07-03: qty 17

## 2018-07-03 NOTE — TOC Initial Note (Signed)
Transition of Care [facility]) - Initial/Assessment Note    Patient Details  Name: Annette Boyd MRN: [id number] Date of Birth: 1960-03-02  [facility]) CM/SW Contact:    Coralyn Helling, LCSW Phone Number: 07/03/2018, 2:42 PM  Clinical Narrative:                   Expected Discharge Plan: Home w Home Health Services Barriers to Discharge: No Barriers Identified   Patient Goals and CMS Choice   CMS Medicare.gov Compare Post Acute Care list provided to:: Patient Choice offered to / list presented to : Patient  Expected Discharge Plan and Services Expected Discharge Plan: Home w Home Health Services   Discharge Planning Services: CM Consult Post Acute Care Choice: Home Health                             HH Arranged: PT [facility] Agency: [facility] Date [facility] Agency Contacted: 07/03/18 Time HH Agency Contacted: 1223 Representative spoke with at [facility] Agency: karen  Prior Living Arrangements/Services   Lives with:: Spouse Patient language and need for interpreter reviewed:: No Do you feel safe going back to the place where you live?: Yes      Need for Family Participation in Patient Care: Yes (Comment) Care giver support system in place?: Yes (comment)   Criminal Activity/Legal Involvement Pertinent to Current Situation/Hospitalization: No - Comment as needed  Activities of Daily Living Home Assistive Devices/Equipment: Eyeglasses ADL Screening (condition at time of admission) Patient's cognitive ability adequate to safely complete daily activities?: Yes Is the patient deaf or have difficulty hearing?: No Does the patient have difficulty seeing, even when wearing glasses/contacts?: No Does the patient have difficulty concentrating, remembering, or making decisions?: No Patient able to express need for assistance with ADLs?: No Does the patient have difficulty dressing or bathing?: No Independently performs ADLs?: Yes (appropriate for developmental  age) Does the patient have difficulty walking or climbing stairs?: No Weakness of Legs: None Weakness of Arms/Hands: None  Permission Sought/Granted                  Emotional Assessment Appearance:: Appears stated age Attitude/Demeanor/Rapport: Engaged Affect (typically observed): Accepting Orientation: : Oriented to Self, Oriented to Place, Oriented to  Time, Oriented to Situation Alcohol / Substance Use: Not Applicable Psych Involvement: No (comment)  Admission diagnosis:  Fall [W19.XXXA] Displaced fracture of right femoral neck (HCC) [S72.001A] Patient Active Problem List   Diagnosis Date Noted  . Nausea 07/02/2018  . Lightheadedness 07/02/2018  . Status post total replacement of right hip   . Closed right hip fracture (HCC) 07/01/2018  . Displaced fracture of right femoral neck (HCC)    PCP:  Patient, No Pcp Per Pharmacy:   [id number] [place], [place] - [address] AT [facility] [address] [place] [address] Phone: [phone] Fax: [phone]     Social Determinants of Health (SDOH) Interventions    Readmission Risk Interventions No flowsheet data found.

## 2018-07-03 NOTE — Progress Notes (Signed)
PROGRESS NOTE    Annette Boyd  VQQ:[id number] DOB: Nov 29, 1960 DOA: 07/01/2018 PCP: Patient, No Pcp Per    Brief Narrative:  Patient is 58 year old female who had a mechanical fall while moving furniture sustaining right hip fracture.  Patient admitted seen in consultation by orthopedics and underwent right total hip arthroplasty on 07/01/2018.  Assessment & Plan:   Principal Problem:   Displaced fracture of right femoral neck (HCC) Active Problems:   Closed right hip fracture (HCC)   Nausea   Lightheadedness   Status post total replacement of right hip   1 moderately displaced proximal right femoral neck fracture Secondary to mechanical fall.  Status post right total hip arthroplasty per Dr. Magnus Ivan 07/01/2018.  Patient seen by PT/OT.  Patient placed on aspirin for DVT prophylaxis per orthopedics.  Patient likely home with home health therapies in 24 hours if stable.  2.  Lightheadedness/nausea Patient with complaints of lightheadedness and nausea.  CBG obtained was 158.  Patient noted not to be orthostatic.  Urinalysis initially concerning for UTI however urine cultures were negative.  Patient gently hydrated for 24 hours.  Patient complained of some grogginess felt likely secondary to anesthesia.  Patient was given a dose of IV Rocephin and will discontinue IV Rocephin after today's dose.  Supportive care.  Follow.    3.  Leukocytosis Likely reactive leukocytosis secondary to problem #1.  Patient however with some complaints of dysuria after Foley catheter was removed.  Urinalysis worrisome for possible UTI.  Patient placed on IV Rocephin.  Urine cultures negative.  Leukocytosis has improved.  Discontinue IV Rocephin after today's dose.   4.  Postop acute blood loss anemia/iron deficiency anemia. Patient with no overt bleeding.  Hemoglobin currently at 9.5 from 11.1.  Also partially dilutional effect.  Follow H&H.  Transfusion threshold hemoglobin less than 7.   DVT prophylaxis:  Aspirin 81 mg twice daily per orthopedics. Code Status: Full Family Communication: Updated patient.  No family at bedside. Disposition Plan: Likely home with home health therapy tomorrow if clinically improved.     Consultants:   Orthopedics: Dr. Magnus Ivan 07/01/2018  Procedures:   Right total hip arthroplasty through direct anterior approach per Dr. Magnus Ivan 07/01/2018  Plain films of the right hip and pelvis 07/01/2018  Antimicrobials:   IV Rocephin 07/02/2018   Subjective: Laying in bed.  Patient states feeling groggy and thinks he might be side effects from the anesthesia as patient states when she had a colonoscopy took about 3 to 4 days to get over anesthesia.  Denies any further dysuria.  No chest pain.  No shortness of breath.  Objective: Vitals:   07/02/18 1622 07/02/18 2126 07/03/18 0123 07/03/18 0535  BP: (!) 128/59 (!) 139/57 121/60 134/68  Pulse: 81 87 88 85  Resp: Temp: 97.8 F (36.6 C) 98 F (36.7 C) 98.7 F (37.1 C) 98.4 F (36.9 C)  TempSrc:  Oral Oral Oral  SpO2: 100% 98% 94% 97%  Weight:      Height:        Intake/Output Summary (Last 24 hours) at 07/03/2018 1150 Last data filed at 07/03/2018 1000 Gross per 24 hour  Intake 2697.28 ml  Output 300 ml  Net 2397.28 ml   Filed Weights   07/01/18 1208  Weight: 68 kg    Examination:  General exam: NAD Respiratory system: CTAB.  No wheezes, no crackles, no rhonchi.  Respiratory effort normal. Cardiovascular system: Regular rate rhythm no murmurs rubs or gallops.  No JVD.  No lower extremity edema.  Gastrointestinal system: Abdomen is soft, nontender, nondistended, positive bowel sounds.  No rebound.  No guarding.  Central nervous system: Alert and oriented. No focal neurological deficits. Extremities: Symmetric 5 x 5 power. Skin: No rashes, lesions or ulcers Psychiatry: Judgement and insight appear normal. Mood & affect appropriate.     Data Reviewed: I have personally reviewed  following labs and imaging studies  CBC: Recent Labs  Lab 07/01/18 1311 07/02/18 0444 07/03/18 0433  WBC 9.9 12.1* 8.2  NEUTROABS 8.8*  --  6.6  HGB 12.1 11.1* 9.5*  HCT 38.1 35.3* 30.8*  MCV 94.1 95.1 96.9  PLT 199 156 159   Basic Metabolic Panel: Recent Labs  Lab 07/01/18 1311 07/02/18 0444 07/03/18 0433  NA 139 138 138  K 3.9 4.5 3.8  CL 105 105 105  CO2 GLUCOSE 111* 135* 109*  BUN CREATININE 0.63 0.69 0.65  CALCIUM 9.1 8.6* 8.2*  MG  --   --  1.8   GFR: Estimated Creatinine Clearance: 70 mL/min (by C-G formula based on SCr of 0.65 mg/dL). Liver Function Tests: No results for input(s): AST, ALT, ALKPHOS, BILITOT, PROT, ALBUMIN in the last 168 hours. No results for input(s): LIPASE, AMYLASE in the last 168 hours. No results for input(s): AMMONIA in the last 168 hours. Coagulation Profile: No results for input(s): INR, PROTIME in the last 168 hours. Cardiac Enzymes: No results for input(s): CKTOTAL, CKMB, CKMBINDEX, TROPONINI in the last 168 hours. BNP (last 3 results) No results for input(s): PROBNP in the last 8760 hours. HbA1C: No results for input(s): HGBA1C in the last 72 hours. CBG: Recent Labs  Lab 07/02/18 1024  GLUCAP 159*   Lipid Profile: No results for input(s): CHOL, HDL, LDLCALC, TRIG, CHOLHDL, LDLDIRECT in the last 72 hours. Thyroid Function Tests: No results for input(s): TSH, T4TOTAL, FREET4, T3FREE, THYROIDAB in the last 72 hours. Anemia Panel: Recent Labs    07/03/18 0851  VITAMINB12 401  FOLATE 6.1  FERRITIN 178  TIBC 232*  IRON 9*   Sepsis Labs: No results for input(s): PROCALCITON, LATICACIDVEN in the last 168 hours.  Recent Results (from the past 240 hour(s))  SARS Coronavirus 2 (Hosp order,Performed in [facility] lab via [employer] [facility])     Status: None   Collection Time: 07/01/18  1:40 PM  Result Value Ref Range Status   SARS Coronavirus 2 ([employer] Now) NEGATIVE NEGATIVE Final    Comment: (NOTE)  Interpretive Result Comment(s): COVID 19 Positive SARS CoV 2 target nucleic acids are DETECTED. The SARS CoV 2 RNA is generally detectable in upper and lower respiratory specimens during the acute phase of infection.  Positive results are indicative of active infection with SARS CoV 2.  Clinical correlation with patient history and other diagnostic information is necessary to determine patient infection status.  Positive results do not rule out bacterial infection or coinfection with other viruses. The expected result is Negative. COVID 19 Negative SARS CoV 2 target nucleic acids are NOT DETECTED. The SARS CoV 2 RNA is generally detectable in upper and lower respiratory specimens during the acute phase of infection.  Negative results do not preclude SARS CoV 2 infection, do not rule out coinfections with other pathogens, and should not be used as the sole basis for treatment or other patient management decisions.  Negative results must be combined with clinical  observations, patient history, and epidemiological information. The expected result is  Negative. Invalid Presence or absence of SARS CoV 2 nucleic acids cannot be determined. Repeat testing was performed on the submitted specimen and repeated Invalid results were obtained.  If clinically indicated, additional testing on a new specimen with an alternate test methodology [phone]) is advised.  The SARS CoV 2 RNA is generally detectable in upper and lower respiratory specimens during the acute phase of infection. The expected result is Negative. Fact Sheet for Patients:  http://www.graves-ford.org/ Fact Sheet for Healthcare Providers: EnviroConcern.si This test is not yet approved or cleared by the [place] FDA and has been authorized for detection and/or diagnosis of SARS CoV 2 by FDA under an Emergency Use Authorization (EUA).  This EUA will remain in effect (meaning this test  can be used) for the duration of the COVID19 d eclaration under Section 564(b)(1) of the Act, 21 U.S.C. section [phone] 3(b)(1), unless the authorization is terminated or revoked sooner. Performed at [facility], [address] [place], [place] [address]   SARS Coronavirus 2 (CEPHEID - Performed in [facility] hospital lab), Hosp Order     Status: None   Collection Time: 07/01/18  4:45 PM  Result Value Ref Range Status   SARS Coronavirus 2 NEGATIVE NEGATIVE Final    Comment: (NOTE) If result is NEGATIVE SARS-CoV-2 target nucleic acids are NOT DETECTED. The SARS-CoV-2 RNA is generally detectable in upper and lower  respiratory specimens during the acute phase of infection. The lowest  concentration of SARS-CoV-2 viral copies this assay can detect is 250  copies / mL. A negative result does not preclude SARS-CoV-2 infection  and should not be used as the sole basis for treatment or other  patient management decisions.  A negative result may occur with  improper specimen collection / handling, submission of specimen other  than nasopharyngeal swab, presence of viral mutation(s) within the  areas targeted by this assay, and inadequate number of viral copies  (<250 copies / mL). A negative result must be combined with clinical  observations, patient history, and epidemiological information. If result is POSITIVE SARS-CoV-2 target nucleic acids are DETECTED. The SARS-CoV-2 RNA is generally detectable in upper and lower  respiratory specimens dur ing the acute phase of infection.  Positive  results are indicative of active infection with SARS-CoV-2.  Clinical  correlation with patient history and other diagnostic information is  necessary to determine patient infection status.  Positive results do  not rule out bacterial infection or co-infection with other viruses. If result is PRESUMPTIVE POSTIVE SARS-CoV-2 nucleic acids MAY BE PRESENT.   A presumptive positive result was  obtained on the submitted specimen  and confirmed on repeat testing.  While 2019 novel coronavirus  (SARS-CoV-2) nucleic acids may be present in the submitted sample  additional confirmatory testing may be necessary for epidemiological  and / or clinical management purposes  to differentiate between  SARS-CoV-2 and other Sarbecovirus currently known to infect humans.  If clinically indicated additional testing with an alternate test  methodology [phone]) is advised. The SARS-CoV-2 RNA is generally  detectable in upper and lower respiratory sp ecimens during the acute  phase of infection. The expected result is Negative. Fact Sheet for Patients:  BoilerBrush.com.cy Fact Sheet for Healthcare Providers: https://pope.com/ This test is not yet approved or cleared by the [place] FDA and has been authorized for detection and/or diagnosis of SARS-CoV-2 by FDA under an Emergency Use Authorization (EUA).  This EUA will remain in effect (meaning this test can be  used) for the duration of the COVID-19 declaration under Section 564(b)(1) of the Act, 21 U.S.C. section 360bbb-3(b)(1), unless the authorization is terminated or revoked sooner. Performed at [facility], [address] [place], [place] [address]   Surgical pcr screen     Status: None   Collection Time: 07/01/18  4:47 PM  Result Value Ref Range Status   MRSA, PCR NEGATIVE NEGATIVE Final   Staphylococcus aureus NEGATIVE NEGATIVE Final    Comment: (NOTE) The Xpert SA Assay (FDA approved for NASAL specimens in patients 50 years of age and older), is one component of a comprehensive surveillance program. It is not intended to diagnose infection nor to guide or monitor treatment. Performed at [facility], [address], [place], [place] [address]   Culture, Urine     Status: Abnormal   Collection Time: 07/02/18 10:25 AM  Result Value Ref  Range Status   Specimen Description   Final    URINE, RANDOM Performed at [facility], [address] [place], [place] [address]    Special Requests   Final    NONE Performed at [facility], [address], [place], [place] [address]    Culture (A)  Final    <10,000 COLONIES/mL INSIGNIFICANT GROWTH Performed at [facility], [address], [place], [place] [address]    Report Status 07/03/2018 FINAL  Final         Radiology Studies: Dg Chest 1 View  Result Date: 07/01/2018 CLINICAL DATA:  Right hip fracture. EXAM: CHEST  1 VIEW COMPARISON:  None. FINDINGS: The heart size and mediastinal contours are within normal limits. Both lungs are clear. The visualized skeletal structures are unremarkable. IMPRESSION: No active disease. Electronically Signed   By: Lupita Raider M.D.   On: 07/01/2018 13:01   Dg Pelvis Portable  Result Date: 07/01/2018 CLINICAL DATA:  The patient suffered a subcapital right hip fracture in a fall 06/30/2018. Status post hip replacement today. Initial encounter. EXAM: PORTABLE PELVIS 1-2 VIEWS COMPARISON:  Plain films right hip 07/01/2018. FINDINGS: New right total hip replacement is identified. The device is located. No fracture. Gas in the soft tissues from surgery noted. Imaged bones and joints otherwise appear normal. IMPRESSION: Status post right hip replacement.  No acute abnormality. Electronically Signed   By: Drusilla Kanner M.D.   On: 07/01/2018 21:01   Dg C-arm 1-60 Min-no Report  Result Date: 07/01/2018 Fluoroscopy was utilized by the requesting physician.  No radiographic interpretation.   Dg Hip Port Unilat With Pelvis 1v Right  Result Date: 07/01/2018 CLINICAL DATA:  Right femur fracture EXAM: DG HIP (WITH OR WITHOUT PELVIS) 1V PORT RIGHT COMPARISON:  07/01/2018 FINDINGS: Seven low resolution intraoperative spot views of the right hip. Total fluoroscopy time was 24 seconds. Initial images  demonstrate right femoral neck fracture. Subsequent images demonstrate right hip replacement with normal alignment. IMPRESSION: Intraoperative fluoroscopic assistance provided during right hip replacement for femoral neck fracture Electronically Signed   By: Jasmine Pang M.D.   On: 07/01/2018 20:23   Dg Hip Unilat W Or Wo Pelvis 2-3 Views Right  Result Date: 07/01/2018 CLINICAL DATA:  Right hip pain after fall yesterday. EXAM: DG HIP (WITH OR WITHOUT PELVIS) 2-3V RIGHT COMPARISON:  None. FINDINGS: Moderately displaced fracture is seen involving the proximal right femoral neck. No significant degenerative changes noted. Left hip appears normal. IMPRESSION: Moderately displaced proximal right femoral neck fracture. Electronically Signed   By: Fayrene Fearing  Christen ButterGreen Jr M.D.   On: 07/01/2018 13:01        Scheduled Meds: . aspirin  81 mg Oral BID  . docusate sodium  100 mg Oral BID  . feeding supplement  1 Container Oral Q24H  . feeding supplement (ENSURE ENLIVE)  237 mL Oral Q24H  . gabapentin  100 mg Oral TID  . multivitamin with minerals  1 tablet Oral Daily  . pantoprazole  40 mg Oral Daily   Continuous Infusions: . cefTRIAXone (ROCEPHIN)  IV 1 g (07/02/18 1742)  . methocarbamol (ROBAXIN) IV       LOS: 2 days    Time spent: 35 minutes    Ramiro Harvestaniel Thompson, MD [facility]  If 7PM-7AM, please contact night-coverage [email or web address] 07/03/2018, 11:50 AM

## 2018-07-03 NOTE — Progress Notes (Signed)
Patient ID: Annette Boyd, female   DOB: 1960/08/08, 58 y.o.   MRN: [id number] No acute changes.  She does report a headache today and take Excederin at home for this.  I will order 600 mg ibuprofen to try for her headache.  Her right hip is stable.  She hopes to be able to go home this afternoon if feeling better and clears therapy.

## 2018-07-03 NOTE — Progress Notes (Signed)
Physical Therapy Treatment Patient Details Name: Annette Boyd MRN: [id number] DOB: 05/18/1960 Today's Date: 07/03/2018    History of Present Illness Pt is a 58 y/o female whom sustained a right femoral neck fracture while moving furniure with her husband. She is now s/p Right total hip arthroplasty through direct anterior approach. Pt does not have a significant PMH.,    PT Comments    Pt cooperative but requiring increased time for all tasks and limited by pain/fatigue.     Follow Up Recommendations  Follow surgeon's recommendation for DC plan and follow-up therapies     Equipment Recommendations  Rolling walker with 5" wheels    Recommendations for Other Services       Precautions / Restrictions Precautions Precautions: Fall Precaution Booklet Issued: No Restrictions Weight Bearing Restrictions: No RLE Weight Bearing: Weight bearing as tolerated    Mobility  Bed Mobility Overal bed mobility: Needs Assistance Bed Mobility: Supine to Sit;Sit to Supine     Supine to sit: Min assist Sit to supine: Min assist   General bed mobility comments: increased time and cues for sequence with min assist for R LE managment  Transfers Overall transfer level: Needs assistance Equipment used: Rolling walker (2 wheeled) Transfers: Sit to/from Stand Sit to Stand: Min assist;Min guard         General transfer comment: VC's for initial safety and sequencing with RW.  Ambulation/Gait Ambulation/Gait assistance: Min guard Gait Distance (Feet): 75 Feet Assistive device: Rolling walker (2 wheeled) Gait Pattern/deviations: Step-to pattern;Decreased step length - right;Decreased step length - left;Shuffle;Trunk flexed Gait velocity: decr   General Gait Details: Increased time with cues for posture, sequence and position from RW   Stairs Stairs: Yes Stairs assistance: Min assist Stair Management: No rails;Step to pattern;Backwards;Forwards Number of Stairs: 2 General stair  comments: single step fwd and bkwd with cues for sequence and foot/RW placement   Wheelchair Mobility    Modified Rankin (Stroke Patients Only)       Balance Overall balance assessment: No apparent balance deficits (not formally assessed)                                          Cognition Arousal/Alertness: Awake/alert Behavior During Therapy: WFL for tasks assessed/performed Overall Cognitive Status: Within Functional Limits for tasks assessed                                        Exercises Total Joint Exercises Ankle Circles/Pumps: AROM;Both;20 reps;Supine Quad Sets: AROM;Both;10 reps;Supine Heel Slides: AAROM;Right;Supine;20 reps Hip ABduction/ADduction: AAROM;Right;15 reps;Supine    General Comments        Pertinent Vitals/Pain Pain Assessment: 0-10 Pain Score: 8  Pain Location: R hip Pain Descriptors / Indicators: Aching;Burning;Sore Pain Intervention(s): Limited activity within patient's tolerance;Monitored during session;Premedicated before session;Ice applied    Home Living                      Prior Function            PT Goals (current goals can now be found in the care plan section) Acute Rehab PT Goals Patient Stated Goal[facility] Goal Formulation: With patient Time For Goal Achievement: 07/09/18 Potential to Achieve Goals: Good Progress towards PT goals: Progressing toward goals  Frequency    7X/week      PT Plan Current plan remains appropriate    Co-evaluation              AM-PAC PT "6 Clicks" Mobility   Outcome Measure  Help needed turning from your back to your side while in a flat bed without using bedrails?: A Little Help needed moving from lying on your back to sitting on the side of a flat bed without using bedrails?: A Little Help needed moving to and from a bed to a chair (including a wheelchair)?: A Little Help needed standing up from a chair using your arms  (e.g., wheelchair or bedside chair)?: A Little Help needed to walk in hospital room?: A Little Help needed climbing 3-5 steps with a railing? : A Little 6 Click Score: 18    End of Session Equipment Utilized During Treatment: Gait belt Activity Tolerance: Patient limited by fatigue;Patient limited by pain Patient left: in bed;with call bell/phone within reach Nurse Communication: Mobility status PT Visit Diagnosis: Difficulty in walking, not elsewhere classified (R26.2)     Time: 8110-3159 PT Time Calculation (min) (ACUTE ONLY): 45 min  Charges:  $Gait Training: 8-22 mins $Therapeutic Exercise: 8-22 mins $Therapeutic Activity: 8-22 mins                     Mauro Kaufmann PT Acute Rehabilitation Services Pager [phone] Office [phone]    Annette Boyd 07/03/2018, 10:10 AM

## 2018-07-03 NOTE — Progress Notes (Signed)
Occupational Therapy Treatment Patient Details Name: Annette Boyd MRN: [id number] DOB: Jun 09, 1960 Today's Date: 07/03/2018    History of present illness Pt is a 58 y/o female whom sustained a right femoral neck fracture while moving furniure with her husband. She is now s/p Right total hip arthroplasty through direct anterior approach. Pt does not have a significant PMH.,   OT comments  Pt progressing toward stated goals, still presenting light headed and nauseous this date. Pt feeling increased dizziness and nausea when sitting up, increased time for this to pass. Pt completing bed mobility at min A level for RLE mgt. Pt completed toilet t/f at min guard A level, set up for hygiene with lateral leans. She was able to perform hand hygiene at the sink with min guard A for safety as well. LB ADL equipment introduced, pt practiced with long handled sponge, reacher, and shoe horn at min A level. Pt needing to lie back down, started sweating with increased nausea. Requesting sock aide demonstration from bed, did not practice. Pt will benefit from further LB ADL practice with equipment and shower transfer. Will continue to follow acutely, d/c plans remain appropriate.   Follow Up Recommendations  No OT follow up;Follow surgeon's recommendation for DC plan and follow-up therapies;Supervision - Intermittent    Equipment Recommendations  3 in 1 bedside commode;Other (comment)(LB AE)    Recommendations for Other Services      Precautions / Restrictions Precautions Precautions: Fall Precaution Booklet Issued: No Restrictions Weight Bearing Restrictions: No RLE Weight Bearing: Weight bearing as tolerated       Mobility Bed Mobility Overal bed mobility: Needs Assistance Bed Mobility: Supine to Sit;Sit to Supine     Supine to sit: Min assist Sit to supine: Min assist   General bed mobility comments: increased time and cues for sequencing; light sassist for RLE mgt  Transfers Overall transfer  level: Needs assistance Equipment used: Rolling walker (2 wheeled) Transfers: Sit to/from Stand Sit to Stand: Min assist;Min guard         General transfer comment: VC's for initial safety and sequencing with RW.    Balance Overall balance assessment: Mild deficits observed, not formally tested                                         ADL either performed or assessed with clinical judgement   ADL Overall ADL's : Needs assistance/impaired     Grooming: Min guard;Standing;Wash/dry hands       Lower Body Bathing: Minimal assistance;Sit to/from stand;Sitting/lateral leans Lower Body Bathing Details (indicate cue type and reason): using with long handled sponge     Lower Body Dressing: Minimal assistance;Sit to/from stand;Sitting/lateral leans Lower Body Dressing Details (indicate cue type and reason): with reacher using pillow case to simulate Toilet Transfer: Min guard;Ambulation;Grab bars;RW Statistician Details (indicate cue type and reason): 3:1 over toilet Toileting- Clothing Manipulation and Hygiene: Set up;Sitting/lateral lean     Tub/Shower Transfer Details (indicate cue type and reason): reports having shower seat at home with walk in shower   General ADL Comments: Pt improving from min A to min guard for ~75% of session. Continues to be groggy and light headed. Practiced toilet t/f and LB ADL with equipmenet. unable to finish entirety of equipment due to dizziness and nausea     Vision Baseline Vision/History: Wears glasses Wears Glasses: At all times Patient Visual Report: No  change from baseline     Perception     Praxis      Cognition Arousal/Alertness: Awake/alert Behavior During Therapy: WFL for tasks assessed/performed Overall Cognitive Status: Within Functional Limits for tasks assessed                                 General Comments: generally groggy from medications but cognitively intact        Exercises      Shoulder Instructions       General Comments pt feeling groggy from medications; VSS; light headed and nauseous needing to lie down at end of session    Pertinent Vitals/ Pain       Pain Assessment: Faces Faces Pain Scale: Hurts even more Pain Location: R hip Pain Descriptors / Indicators: Aching;Burning;Sore Pain Intervention(s): Limited activity within patient's tolerance;Monitored during session;Repositioned;Ice applied  Home Living                                          Prior Functioning/Environment              Frequency  Min 2X/week        Progress Toward Goals  OT Goals(current goals can now be found in the care plan section)  Progress towards OT goals: Progressing toward goals  Acute Rehab OT Goals Patient Stated [facility] Goal Formulation: With patient Time For Goal Achievement: 07/16/18 Potential to Achieve Goals: Good  Plan Discharge plan remains appropriate    Co-evaluation                 AM-PAC OT "6 Clicks" Daily Activity     Outcome Measure   Help from another person eating meals?: None Help from another person taking care of personal grooming?: A Little Help from another person toileting, which includes using toliet, bedpan, or urinal?: A Little Help from another person bathing (including washing, rinsing, drying)?: A Little Help from another person to put on and taking off regular upper body clothing?: None Help from another person to put on and taking off regular lower body clothing?: A Little 6 Click Score: 20    End of Session Equipment Utilized During Treatment: Rolling walker  OT Visit Diagnosis: Muscle weakness (generalized) (M62.81);Pain Pain - Right/Left: Right Pain - part of body: Hip   Activity Tolerance Patient tolerated treatment well   Patient Left with call bell/phone within reach;with nursing/sitter in room;in bed   Nurse Communication Mobility status        Time:  1339-1410 OT Time Calculation (min): 31 min  Charges: OT General Charges $OT Visit: 1 Visit OT Treatments $Self Care/Home Management : 23-37 mins  Dalphine HandingKaylee Carey Lafon, MSOT, OTR/L Behavioral Health OT/ Acute Relief OT [facility] Office: [phone]   Dalphine HandingKaylee Mohd. Derflinger 07/03/2018, 3:26 PM

## 2018-07-04 DIAGNOSIS — D62 Acute posthemorrhagic anemia: Secondary | ICD-10-CM | POA: Diagnosis not present

## 2018-07-04 DIAGNOSIS — D509 Iron deficiency anemia, unspecified: Secondary | ICD-10-CM

## 2018-07-04 LAB — BASIC METABOLIC PANEL
Anion gap: 8 (ref 5–15)
BUN: 13 mg/dL (ref 6–20)
CO2: 26 mmol/L (ref 22–32)
Calcium: 8.2 mg/dL — ABNORMAL LOW (ref 8.9–10.3)
Chloride: 105 mmol/L (ref 98–111)
Creatinine, Ser: 0.59 mg/dL (ref 0.44–1.00)
GFR calc Af Amer: >60 mL/min (ref >60)
GFR calc non Af Amer: >60 mL/min (ref >60)
Glucose, Bld: 100 mg/dL — ABNORMAL HIGH (ref 70–99)
Potassium: 3.9 mmol/L (ref 3.5–5.1)
Sodium: 139 mmol/L (ref 135–145)

## 2018-07-04 LAB — CBC WITH DIFFERENTIAL/PLATELET
Abs Immature Granulocytes: 0.02 10*3/uL (ref 0.00–0.07)
Basophils Absolute: 0 10*3/uL (ref 0.0–0.1)
Basophils Relative: 1 %
Eosinophils Absolute: 0.2 10*3/uL (ref 0.0–0.5)
Eosinophils Relative: 3 %
HCT: 27.6 % — ABNORMAL LOW (ref 36.0–46.0)
Hemoglobin: 8.8 g/dL — ABNORMAL LOW (ref 12.0–15.0)
Immature Granulocytes: 0 %
Lymphocytes Relative: 11 %
Lymphs Abs: 0.7 10*3/uL (ref 0.7–4.0)
MCH: 30.8 pg (ref 26.0–34.0)
MCHC: 31.9 g/dL (ref 30.0–36.0)
MCV: 96.5 fL (ref 80.0–100.0)
Monocytes Absolute: 0.6 10*3/uL (ref 0.1–1.0)
Monocytes Relative: 10 %
Neutro Abs: 4.6 10*3/uL (ref 1.7–7.7)
Neutrophils Relative %: 75 %
Platelets: 156 10*3/uL (ref 150–400)
RBC: 2.86 MIL/uL — ABNORMAL LOW (ref 3.87–5.11)
RDW: 13.2 % (ref 11.5–15.5)
WBC: 6.1 10*3/uL (ref 4.0–10.5)
nRBC: 0 % (ref 0.0–0.2)

## 2018-07-04 MED ORDER — FERROUS GLUCONATE 324 (38 FE) MG PO TABS: 324.0000 mg | ORAL_TABLET | Freq: Two times a day (BID) | ORAL | 1 refills | Status: DC

## 2018-07-04 MED ORDER — FERROUS GLUCONATE 324 (38 FE) MG PO TABS
324.0000 mg | ORAL_TABLET | Freq: Two times a day (BID) | ORAL | Status: DC
  Filled 2018-07-04: qty 1

## 2018-07-04 MED ORDER — GABAPENTIN 100 MG PO CAPS: 100.0000 mg | ORAL_CAPSULE | Freq: Three times a day (TID) | ORAL | 1 refills | Status: DC

## 2018-07-04 MED ORDER — POLYSACCHARIDE IRON COMPLEX 150 MG PO CAPS: 150.0000 mg | ORAL_CAPSULE | Freq: Every day | ORAL | 0 refills | Status: AC

## 2018-07-04 NOTE — TOC Progression Note (Signed)
Transition of Care [facility]) - Progression Note    Patient Details  Name: Annette Boyd MRN: [id number] Date of Birth: 09-19-1960  Transition of Care [facility]) CM/SW Contact  Antony Haste, RN Phone Number: 07/04/2018, 11:59 AM  Clinical Narrative:    CM spoke with the patient at the beside. Patient states she is moving to [place], [place] on Tuesday. She states she was previously established with an orthopedic physician in [place] and will call them to get re-established with that practice. CM encouraged her to ask her ortho here if he/she has any recommendations for ongoing follow-up in that area as well. Patient states she knows some people in PT in [place], [place] and will outreach to them for recommendations for home health PT in [place]. States she is able to obtain a list of in-network agencies online and will call when she gets to her new home in [place].   CM called [facility] and informed Barbara Cower this patient is moving to [facility] on Tuesday 07/07/18. [facility] will cancel the pending service due to the member relocating. They do not have an office in [facility].  CM called [facility]. [facility] does not have an office in [place], [place].    [facility] will deliver the patient's rolling walker today.      Expected Discharge Plan: Home w Home Health Services Barriers to Discharge: No Barriers Identified  Expected Discharge Plan and Services Expected Discharge Plan: Home w Home Health Services   Discharge Planning Services: CM Consult Post Acute Care Choice: Home Health   Expected Discharge Date: 07/04/18                         HH Arranged: PT HH Agency: [facility] Date HH Agency Contacted: 07/03/18 Time HH Agency Contacted: 1223 Representative spoke with at [facility] Agency: karen   Social Determinants of Health (SDOH) Interventions    Readmission Risk Interventions No flowsheet data found.

## 2018-07-04 NOTE — Discharge Instructions (Addendum)

## 2018-07-04 NOTE — Progress Notes (Signed)
     Subjective: 3 Days Post-Op Procedure(s) (LRB): TOTAL HIP ARTHROPLASTY ANTERIOR APPROACH (Right) Awake, alert and oriented x 4. Foley discontinued and she has voided. PT to see and work with her. She plans to travel to [place] area this week. Advised that she should be seen in follow up by an orthopaedic specialist in 2-3 weeks post surgery.   Patient reports pain as moderate.    Objective:   VITALS:  Temp:  [97.6 F (36.4 C)-98.9 F (37.2 C)] 98.5 F (36.9 C) (05/30 0528) Pulse Rate:  [78-92] 78 (05/30 0528) Resp:  [16-18] 16 (05/30 0528) BP: (130-146)/(61-66) 146/63 (05/30 0528) SpO2:  [96 %-99 %] 97 % (05/30 0528)  Neurologically intact ABD soft Neurovascular intact Sensation intact distally Intact pulses distally Dorsiflexion/Plantar flexion intact Incision: dressing C/D/I and no drainage No cellulitis present Compartment soft   LABS Recent Labs    07/01/18 1311 07/02/18 0444 07/03/18 0433 07/03/18 1614 07/04/18 0408  HGB 12.1 11.1* 9.5* 9.5* 8.8*  WBC 9.9 12.1* 8.2  --  6.1  PLT 199 156 159  --  156   Recent Labs    07/03/18 0433 07/04/18 0408  NA 138 139  K 3.8 3.9  CL 105 105  CO2 26 26  BUN 17 13  CREATININE 0.65 0.59  GLUCOSE 109* 100*   No results for input(s): LABPT, INR in the last 72 hours.   Assessment/Plan: 3 Days Post-Op Procedure(s) (LRB): TOTAL HIP ARTHROPLASTY ANTERIOR APPROACH (Right)  Advance diet Up with therapy D/C IV fluids Discharge home with home health  She is out of her home locally and was moving at the time that she fell injuring the right hip.   Vira Browns 07/04/2018, 10:59 AMPatient ID: Annette Boyd, female   DOB: Aug 24, 1960, 58 y.o.   MRN: [id number]

## 2018-07-04 NOTE — Progress Notes (Signed)
Physical Therapy Treatment Patient Details Name: Annette Boyd MRN: [id number] DOB: 06-11-1960 Today's Date: 07/04/2018    History of Present Illness Pt is a 58 y/o female whom sustained a right femoral neck fracture while moving furniure with her husband. She is now s/p Right total hip arthroplasty through direct anterior approach. Pt does not have a significant PMH.,    PT Comments    Pt with marked improvement in level of alertness, activity tolerance and performance of all mobility tasks.  Pt performed home therex program with written instruction and progression provided.   Follow Up Recommendations  Follow surgeon's recommendation for DC plan and follow-up therapies     Equipment Recommendations  Rolling walker with 5" wheels    Recommendations for Other Services       Precautions / Restrictions Precautions Precautions: Fall Restrictions Weight Bearing Restrictions: No RLE Weight Bearing: Weight bearing as tolerated    Mobility  Bed Mobility               General bed mobility comments: Pt up on EOB on arrival  Transfers Overall transfer level: Needs assistance Equipment used: Rolling walker (2 wheeled) Transfers: Sit to/from Stand Sit to Stand: Min guard;Supervision         General transfer comment: min cues for use of UEs to self assist  Ambulation/Gait Ambulation/Gait assistance: Min guard;Supervision Gait Distance (Feet): 150 Feet Assistive device: Rolling walker (2 wheeled) Gait Pattern/deviations: Decreased step length - right;Decreased step length - left;Shuffle;Trunk flexed;Step-to pattern;Step-through pattern Gait velocity: decr   General Gait Details: Increased time with cues for posture, sequence and position from [employer]         General stair comments: Pt declines - states she feels comfortable with ability to negotiate step - written instruction provided   Wheelchair Mobility    Modified Rankin (Stroke Patients Only)        Balance Overall balance assessment: Mild deficits observed, not formally tested                                          Cognition Arousal/Alertness: Awake/alert Behavior During Therapy: WFL for tasks assessed/performed Overall Cognitive Status: Within Functional Limits for tasks assessed                                 General Comments: Much more clear headed this am       Exercises Total Joint Exercises Ankle Circles/Pumps: AROM;Both;20 reps;Supine Quad Sets: AROM;Both;10 reps;Supine Heel Slides: AAROM;Right;Supine;20 reps Hip ABduction/ADduction: AAROM;Right;15 reps;Supine Long Arc Quad: AROM;Right;10 reps;Seated    General Comments        Pertinent Vitals/Pain Pain Assessment: 0-10 Pain Score: 4  Pain Location: R hip Pain Descriptors / Indicators: Aching;Burning;Sore Pain Intervention(s): Limited activity within patient's tolerance;Monitored during session;Ice applied    Home Living                      Prior Function            PT Goals (current goals can now be found in the care plan section) Acute Rehab PT Goals Patient Stated Goal: go back to [facility] PT Goal Formulation: With patient Time For Goal Achievement: 07/09/18 Potential to Achieve Goals: Good Progress towards PT goals: Progressing toward goals    Frequency    7X/week  PT Plan Current plan remains appropriate    Co-evaluation              AM-PAC PT "6 Clicks" Mobility   Outcome Measure  Help needed turning from your back to your side while in a flat bed without using bedrails?: A Little Help needed moving from lying on your back to sitting on the side of a flat bed without using bedrails?: A Little Help needed moving to and from a bed to a chair (including a wheelchair)?: A Little Help needed standing up from a chair using your arms (e.g., wheelchair or bedside chair)?: A Little Help needed to walk in hospital room?: A Little Help  needed climbing 3-5 steps with a railing? : A Little 6 Click Score: 18    End of Session Equipment Utilized During Treatment: Gait belt Activity Tolerance: Patient tolerated treatment well Patient left: in chair;with call bell/phone within reach Nurse Communication: Mobility status PT Visit Diagnosis: Difficulty in walking, not elsewhere classified (R26.2)     Time: [id number] PT Time Calculation (min) (ACUTE ONLY): 38 min  Charges:  $Gait Training: 8-22 mins $Therapeutic Exercise: 23-37 mins                     Mauro KaufmannHunter Arham Boyd PT Acute Rehabilitation Services Pager [phone] Office [phone]    Annette Boyd 07/04/2018, 12:46 PM

## 2018-07-04 NOTE — Plan of Care (Signed)
  Problem: Education: Goal: Knowledge of General Education information will improve Description Including pain rating scale, medication(s)/side effects and non-pharmacologic comfort measures Outcome: Adequate for Discharge   Problem: Health Behavior/Discharge Planning: Goal: Ability to manage health-related needs will improve Outcome: Adequate for Discharge   Problem: Clinical Measurements: Goal: Ability to maintain clinical measurements within normal limits will improve Outcome: Adequate for Discharge Goal: Will remain free from infection Outcome: Adequate for Discharge Goal: Diagnostic test results will improve Outcome: Adequate for Discharge Goal: Respiratory complications will improve Outcome: Adequate for Discharge Goal: Cardiovascular complication will be avoided Outcome: Adequate for Discharge   Problem: Activity: Goal: Risk for activity intolerance will decrease Outcome: Adequate for Discharge   Problem: Nutrition: Goal: Adequate nutrition will be maintained Outcome: Adequate for Discharge   Problem: Coping: Goal: Level of anxiety will decrease Outcome: Adequate for Discharge   Problem: Elimination: Goal: Will not experience complications related to bowel motility Outcome: Adequate for Discharge Goal: Will not experience complications related to urinary retention Outcome: Adequate for Discharge   Problem: Pain Managment: Goal: General experience of comfort will improve Outcome: Adequate for Discharge   Problem: Safety: Goal: Ability to remain free from injury will improve Outcome: Adequate for Discharge   Problem: Skin Integrity: Goal: Risk for impaired skin integrity will decrease Outcome: Adequate for Discharge   Problem: Education: Goal: Verbalization of understanding the information provided (i.e., activity precautions, restrictions, etc) will improve Outcome: Adequate for Discharge Goal: Individualized Educational Video(s) Outcome: Adequate for  Discharge   Problem: Activity: Goal: Ability to ambulate and perform ADLs will improve Outcome: Adequate for Discharge   Problem: Clinical Measurements: Goal: Postoperative complications will be avoided or minimized Outcome: Adequate for Discharge   Problem: Self-Concept: Goal: Ability to maintain and perform role responsibilities to the fullest extent possible will improve Outcome: Adequate for Discharge   Problem: Pain Management: Goal: Pain level will decrease Outcome: Adequate for Discharge Home today. Discharge teaching done, written information given.

## 2018-07-04 NOTE — Discharge Summary (Signed)
Physician Discharge Summary  Annette Boyd ZOX:[id number] DOB: [birth year] DOA: 07/01/2018  PCP: Patient, No Pcp Per  Admit date: 07/01/2018 Discharge date: 07/04/2018  Time spent: 50 minutes  Recommendations for Outpatient Follow-up:  1. Follow-up with Dr. Doneen Poisson, orthopedics in 2 weeks 2. Follow-up with orthopedic specialist in [place] in 2 weeks.   Discharge Diagnoses:  Principal Problem:   Displaced fracture of right femoral neck (HCC) Active Problems:   Closed right hip fracture (HCC)   Nausea   Lightheadedness   Status post total replacement of right hip   Acute blood loss anemia   Iron deficiency anemia   Discharge Condition: Stable and improved  Diet recommendation: Regular  Filed Weights   07/01/18 1208  Weight: 68 kg    History of present illness:  Per Dr. Leonard Schwartz is a 58 y.o. female with no medical history. Around 4:00PM on 5/26, patient reports tripping over the door landing with furnature in her hands and fell onto her right hip. Patient took 45 minutes for her to get up and at that time declined transfer to the hospital. Unfortunately, pain worsened overnight and she was not able to ambulate secondary to significant pain.  ED Course: Vitals: Afebrile, normal pulse, normal respirations, slightly elevated BP of 146/69, on room air Labs: Glucose of 111 Imaging: Chest x-ray unremarkable Medications/Course: Zofran given  Hospital Course:  1 moderately displaced proximal right femoral neck fracture Secondary to mechanical fall.  Status post right total hip arthroplasty per Dr. Magnus Ivan 07/01/2018.  Patient seen by PT/OT.  Patient placed on aspirin for DVT prophylaxis per orthopedics.  Patient noted to be moving this week to [place].  Patient will be discharged home with home health therapies.  Outpatient follow-up with orthopedics in [place] in 2 weeks or with Dr. Magnus Ivan in 2 weeks.   2.  Lightheadedness/nausea Patient with complaints of  lightheadedness and nausea.  CBG obtained was 158.  Patient noted not to be orthostatic.  Urinalysis initially concerning for UTI however urine cultures were negative.  Patient gently hydrated for 24 hours.  Patient complained of some grogginess felt likely secondary to anesthesia.  Patient was given a dose of IV Rocephin which was subsequently discontinued after urine cultures were negative.  Patient was monitored and improved clinically and by day of discharge had no further lightheadedness or nausea and grogginess had improved.  Patient be discharged home in stable and improved condition.   3.  Leukocytosis Likely reactive leukocytosis secondary to problem #1.  Patient however with some complaints of dysuria after Foley catheter was removed.  Urinalysis worrisome for possible UTI.  Patient placed on IV Rocephin received 2 doses during the hospitalization.  Patient remained afebrile.  Dysuria resolved by day of discharge..  Urine cultures negative.  Leukocytosis improved and had resolved by day of discharge.   4.  Postop acute blood loss anemia/iron deficiency anemia. Patient with no overt bleeding.  Hemoglobin trickled down to 8.8 from 9.5 from 11.1.  Also partially dilutional effect.    Patient given a dose of IV Feraheme x1.  Patient be discharged home on oral iron supplementation.  Outpatient follow-up.    Procedures:  Right total hip arthroplasty through direct anterior approach per Dr. Magnus Ivan 07/01/2018  Plain films of the right hip and pelvis 07/01/2018   Consultations:  Orthopedics: Dr. Magnus Ivan 07/01/2018  Discharge Exam: Vitals:   07/03/18 2137 07/04/18 0528  BP: 130/66 (!) 146/63  Pulse: 80 78  Resp: 18 16  Temp: 98.9 F (  37.2 C) 98.5 F (36.9 C)  SpO2: 99% 97%    General: NAD Cardiovascular: RRR Respiratory: CTAB  Discharge Instructions   Discharge Instructions    Call MD / Call 911   Complete by:  As directed    If you experience chest pain or shortness of  breath, CALL 911 and be transported to the hospital emergency room.  If you develope a fever above 101 F, pus (white drainage) or increased drainage or redness at the wound, or calf pain, call your surgeon's office.   Change dressing   Complete by:  As directed    You may change your dressing on POD#14, then change the dressing daily with sterile 4 x 4 inch gauze dressing and paper tape.  You may clean the incision with alcohol prior to redressing   Constipation Prevention   Complete by:  As directed    Drink plenty of fluids.  Prune juice may be helpful.  You may use a stool softener, such as Colace (over the counter) 100 mg twice a day.  Use MiraLax (over the counter) for constipation as needed.   Diet - low sodium heart healthy   Complete by:  As directed    Diet general   Complete by:  As directed    Discharge instructions   Complete by:  As directed    INSTRUCTIONS AFTER JOINT REPLACEMENT   Remove items at home which could result in a fall. This includes throw rugs or furniture in walking pathways ICE to the affected joint every three hours while awake for 30 minutes at a time, for at least the first 3-5 days, and then as needed for pain and swelling.  Continue to use ice for pain and swelling. You may notice swelling that will progress down to the foot and ankle.  This is normal after surgery.  Elevate your leg when you are not up walking on it.   Continue to use the breathing machine you got in the hospital (incentive spirometer) which will help keep your temperature down.  It is common for your temperature to cycle up and down following surgery, especially at night when you are not up moving around and exerting yourself.  The breathing machine keeps your lungs expanded and your temperature down.   DIET:  As you were doing prior to hospitalization, we recommend a well-balanced diet.  DRESSING / WOUND CARE / SHOWERING  Keep the surgical dressing until follow up.  The dressing is water  proof, so you can shower without any extra covering.  IF THE DRESSING FALLS OFF or the wound gets wet inside, change the dressing with sterile gauze.  Please use good hand washing techniques before changing the dressing.  Do not use any lotions or creams on the incision until instructed by your surgeon.    ACTIVITY  Increase activity slowly as tolerated, but follow the weight bearing instructions below.   No driving for 6 weeks or until further direction given by your physician.  You cannot drive while taking narcotics.  No lifting or carrying greater than 10 lbs. until further directed by your surgeon. Avoid periods of inactivity such as sitting longer than an hour when not asleep. This helps prevent blood clots.  You may return to work once you are authorized by your doctor.     WEIGHT BEARING   Weight bearing as tolerated with assist device (walker, cane, etc) as directed, use it as long as suggested by your surgeon or therapist, typically at  least 4-6 weeks.   EXERCISES  Results after joint replacement surgery are often greatly improved when you follow the exercise, range of motion and muscle strengthening exercises prescribed by your doctor. Safety measures are also important to protect the joint from further injury. Any time any of these exercises cause you to have increased pain or swelling, decrease what you are doing until you are comfortable again and then slowly increase them. If you have problems or questions, call your caregiver or physical therapist for advice.   Rehabilitation is important following a joint replacement. After just a few days of immobilization, the muscles of the leg can become weakened and shrink (atrophy).  These exercises are designed to build up the tone and strength of the thigh and leg muscles and to improve motion. Often times heat used for twenty to thirty minutes before working out will loosen up your tissues and help with improving the range of motion but  do not use heat for the first two weeks following surgery (sometimes heat can increase post-operative swelling).   These exercises can be done on a training (exercise) mat, on the floor, on a table or on a bed. Use whatever works the best and is most comfortable for you.    Use music or television while you are exercising so that the exercises are a pleasant break in your day. This will make your life better with the exercises acting as a break in your routine that you can look forward to.   Perform all exercises about fifteen times, three times per day or as directed.  You should exercise both the operative leg and the other leg as well.  Exercises include:   Quad Sets - Tighten up the muscle on the front of the thigh (Quad) and hold for 5-10 seconds.   Straight Leg Raises - With your knee straight (if you were given a brace, keep it on), lift the leg to 60 degrees, hold for 3 seconds, and slowly lower the leg.  Perform this exercise against resistance later as your leg gets stronger.  Leg Slides: Lying on your back, slowly slide your foot toward your buttocks, bending your knee up off the floor (only go as far as is comfortable). Then slowly slide your foot back down until your leg is flat on the floor again.  Angel Wings: Lying on your back spread your legs to the side as far apart as you can without causing discomfort.  Hamstring Strength:  Lying on your back, push your heel against the floor with your leg straight by tightening up the muscles of your buttocks.  Repeat, but this time bend your knee to a comfortable angle, and push your heel against the floor.  You may put a pillow under the heel to make it more comfortable if necessary.   A rehabilitation program following joint replacement surgery can speed recovery and prevent re-injury in the future due to weakened muscles. Contact your doctor or a physical therapist for more information on knee rehabilitation.    CONSTIPATION  Constipation  is defined medically as fewer than three stools per week and severe constipation as less than one stool per week.  Even if you have a regular bowel pattern at home, your normal regimen is likely to be disrupted due to multiple reasons following surgery.  Combination of anesthesia, postoperative narcotics, change in appetite and fluid intake all can affect your bowels.   YOU MUST use at least one of the following options; they  are listed in order of increasing strength to get the job done.  They are all available over the counter, and you may need to use some, POSSIBLY even all of these options:    Drink plenty of fluids (prune juice may be helpful) and high fiber foods Colace 100 mg by mouth twice a day  Senokot for constipation as directed and as needed Dulcolax (bisacodyl), take with full glass of water  Miralax (polyethylene glycol) once or twice a day as needed.  If you have tried all these things and are unable to have a bowel movement in the first 3-4 days after surgery call either your surgeon or your primary doctor.    If you experience loose stools or diarrhea, hold the medications until you stool forms back up.  If your symptoms do not get better within 1 week or if they get worse, check with your doctor.  If you experience "the worst abdominal pain ever" or develop nausea or vomiting, please contact the office immediately for further recommendations for treatment.   ITCHING:  If you experience itching with your medications, try taking only a single pain pill, or even half a pain pill at a time.  You can also use Benadryl over the counter for itching or also to help with sleep.   TED HOSE STOCKINGS:  Use stockings on both legs until for at least 2 weeks or as directed by physician office. They may be removed at night for sleeping.  MEDICATIONS:  See your medication summary on the "After Visit Summary" that nursing will review with you.  You may have some home medications which will be  placed on hold until you complete the course of blood thinner medication.  It is important for you to complete the blood thinner medication as prescribed.  PRECAUTIONS:  If you experience chest pain or shortness of breath - call 911 immediately for transfer to the hospital emergency department.   If you develop a fever greater that 101 F, purulent drainage from wound, increased redness or drainage from wound, foul odor from the wound/dressing, or calf pain - CONTACT YOUR SURGEON.                                                   FOLLOW-UP APPOINTMENTS:  If you do not already have a post-op appointment, please call the office for an appointment to be seen by your surgeon.  Guidelines for how soon to be seen are listed in your "After Visit Summary", but are typically between 1-4 weeks after surgery.  OTHER INSTRUCTIONS:   Knee Replacement:  Do not place pillow under knee, focus on keeping the knee straight while resting. CPM instructions: 0-90 degrees, 2 hours in the morning, 2 hours in the afternoon, and 2 hours in the evening. Place foam block, curve side up under heel at all times except when in CPM or when walking.  DO NOT modify, tear, cut, or change the foam block in any way.  MAKE SURE YOU:  Understand these instructions.  Get help right away if you are not doing well or get worse.    Thank you for letting us be a part of your medical care team.  It is a privilege we respect greatly.  We hope these instructions will help you stay on track for a fast and full recovery!  Follow the hip precautions as taught in Physical Therapy   Complete by:  As directed    Increase activity slowly   Complete by:  As directed    Increase activity slowly as tolerated   Complete by:  As directed    TED hose   Complete by:  As directed    Use stockings (TED hose) for 4 weeks on both leg(s).  You may remove them at night for sleeping.     Allergies as of 07/04/2018      Reactions   Anesthesia S-i-60  Nausea And Vomiting   Vomits for several hours.  Any Opiates makes her vomits   Morphine And Related Nausea And Vomiting      Medication List    TAKE these medications   aspirin 81 MG chewable tablet Chew 1 tablet (81 mg total) by mouth 2 (two) times daily.   gabapentin 100 MG capsule Commonly known as:  NEURONTIN Take 1 capsule (100 mg total) by mouth 3 (three) times daily.   iron polysaccharides 150 MG capsule Commonly known as:  Nu-Iron Take 1 capsule (150 mg total) by mouth daily.   methocarbamol 500 MG tablet Commonly known as:  ROBAXIN Take 1 tablet (500 mg total) by mouth every 6 (six) hours as needed for muscle spasms.   multivitamin with minerals tablet Take 1 tablet by mouth daily.   traMADol 50 MG tablet Commonly known as:  ULTRAM Take 2 tablets (100 mg total) by mouth every 6 (six) hours as needed for moderate pain (if oxycodone alone insufficient).            Durable Medical Equipment  (From admission, onward)         Start     Ordered   07/03/18 1647  For home use only DME 3 n 1  Once     07/03/18 1646   07/01/18 2334  DME Walker rolling  Once    Question:  Patient needs a walker to treat with the following condition  Answer:  Status post total replacement of right hip   07/01/18 2333   07/01/18 2334  DME 3 n 1  Once     07/01/18 2333           Discharge Care Instructions  (From admission, onward)         Start     Ordered   07/04/18 0000  Change dressing    Comments:  You may change your dressing on POD#14, then change the dressing daily with sterile 4 x 4 inch gauze dressing and paper tape.  You may clean the incision with alcohol prior to redressing   07/04/18 1110         Allergies  Allergen Reactions  . Anesthesia S-I-60 Nausea And Vomiting    Vomits for several hours.  Any Opiates makes her vomits  . Morphine And Related Nausea And Vomiting   Follow-up Information    Kathryne Hitch, MD. Schedule an appointment as  soon as possible for a visit in 2 week(s).   Specialty:  Orthopedic Surgery Contact information: [address] [place] [address] [phone]        Orthopaedic specialist in [place] Follow up in 2 week(s).   Why:  Please have your primary care physician in [place] refer you to an orthopaedic specialist for follow up 2 weeks following your surgery.            The results of significant diagnostics from this hospitalization (  including imaging, microbiology, ancillary and laboratory) are listed below for reference.    Significant Diagnostic Studies: Dg Chest 1 View  Result Date: 07/01/2018 CLINICAL DATA:  Right hip fracture. EXAM: CHEST  1 VIEW COMPARISON:  None. FINDINGS: The heart size and mediastinal contours are within normal limits. Both lungs are clear. The visualized skeletal structures are unremarkable. IMPRESSION: No active disease. Electronically Signed   By: Lupita Raider M.D.   On: 07/01/2018 13:01   Dg Pelvis Portable  Result Date: 07/01/2018 CLINICAL DATA:  The patient suffered a subcapital right hip fracture in a fall 06/30/2018. Status post hip replacement today. Initial encounter. EXAM: PORTABLE PELVIS 1-2 VIEWS COMPARISON:  Plain films right hip 07/01/2018. FINDINGS: New right total hip replacement is identified. The device is located. No fracture. Gas in the soft tissues from surgery noted. Imaged bones and joints otherwise appear normal. IMPRESSION: Status post right hip replacement.  No acute abnormality. Electronically Signed   By: Drusilla Kanner M.D.   On: 07/01/2018 21:01   Dg C-arm 1-60 Min-no Report  Result Date: 07/01/2018 Fluoroscopy was utilized by the requesting physician.  No radiographic interpretation.   Dg Hip Port Unilat With Pelvis 1v Right  Result Date: 07/01/2018 CLINICAL DATA:  Right femur fracture EXAM: DG HIP (WITH OR WITHOUT PELVIS) 1V PORT RIGHT COMPARISON:  07/01/2018 FINDINGS: Seven low resolution intraoperative spot  views of the right hip. Total fluoroscopy time was 24 seconds. Initial images demonstrate right femoral neck fracture. Subsequent images demonstrate right hip replacement with normal alignment. IMPRESSION: Intraoperative fluoroscopic assistance provided during right hip replacement for femoral neck fracture Electronically Signed   By: Jasmine Pang M.D.   On: 07/01/2018 20:23   Dg Hip Unilat W Or Wo Pelvis 2-3 Views Right  Result Date: 07/01/2018 CLINICAL DATA:  Right hip pain after fall yesterday. EXAM: DG HIP (WITH OR WITHOUT PELVIS) 2-3V RIGHT COMPARISON:  None. FINDINGS: Moderately displaced fracture is seen involving the proximal right femoral neck. No significant degenerative changes noted. Left hip appears normal. IMPRESSION: Moderately displaced proximal right femoral neck fracture. Electronically Signed   By: Lupita Raider M.D.   On: 07/01/2018 13:01    Microbiology: Recent Results (from the past 240 hour(s))  SARS Coronavirus 2 (Hosp order,Performed in [facility] lab via [employer])     Status: None   Collection Time: 07/01/18  1:40 PM  Result Value Ref Range Status   SARS Coronavirus 2 ([employer] Now) NEGATIVE NEGATIVE Final    Comment: (NOTE) Interpretive Result Comment(s): COVID 19 Positive SARS CoV 2 target nucleic acids are DETECTED. The SARS CoV 2 RNA is generally detectable in upper and lower respiratory specimens during the acute phase of infection.  Positive results are indicative of active infection with SARS CoV 2.  Clinical correlation with patient history and other diagnostic information is necessary to determine patient infection status.  Positive results do not rule out bacterial infection or coinfection with other viruses. The expected result is Negative. COVID 19 Negative SARS CoV 2 target nucleic acids are NOT DETECTED. The SARS CoV 2 RNA is generally detectable in upper and lower respiratory specimens during the acute phase of infection.  Negative results do  not preclude SARS CoV 2 infection, do not rule out coinfections with other pathogens, and should not be used as the sole basis for treatment or other patient management decisions.  Negative results must be combined with clinical  observations, patient history, and epidemiological information. The expected result is  Negative. Invalid Presence or absence of SARS CoV 2 nucleic acids cannot be determined. Repeat testing was performed on the submitted specimen and repeated Invalid results were obtained.  If clinically indicated, additional testing on a new specimen with an alternate test methodology [id number]) is advised.  The SARS CoV 2 RNA is generally detectable in upper and lower respiratory specimens during the acute phase of infection. The expected result is Negative. Fact Sheet for Patients:  http://www.graves-ford.org/ Fact Sheet for Healthcare Providers: EnviroConcern.si This test is not yet approved or cleared by the [place] FDA and has been authorized for detection and/or diagnosis of SARS CoV 2 by FDA under an Emergency Use Authorization (EUA).  This EUA will remain in effect (meaning this test can be used) for the duration of the COVID19 d eclaration under Section 564(b)(1) of the Act, 21 U.S.C. section [phone] 3(b)(1), unless the authorization is terminated or revoked sooner. Performed at [facility], [address] [place], [place] [address]   SARS Coronavirus 2 (CEPHEID - Performed in [facility] hospital lab), Hosp Order     Status: None   Collection Time: 07/01/18  4:45 PM  Result Value Ref Range Status   SARS Coronavirus 2 NEGATIVE NEGATIVE Final    Comment: (NOTE) If result is NEGATIVE SARS-CoV-2 target nucleic acids are NOT DETECTED. The SARS-CoV-2 RNA is generally detectable in upper and lower  respiratory specimens during the acute phase of infection. The lowest  concentration of SARS-CoV-2 viral copies  this assay can detect is 250  copies / mL. A negative result does not preclude SARS-CoV-2 infection  and should not be used as the sole basis for treatment or other  patient management decisions.  A negative result may occur with  improper specimen collection / handling, submission of specimen other  than nasopharyngeal swab, presence of viral mutation(s) within the  areas targeted by this assay, and inadequate number of viral copies  (<250 copies / mL). A negative result must be combined with clinical  observations, patient history, and epidemiological information. If result is POSITIVE SARS-CoV-2 target nucleic acids are DETECTED. The SARS-CoV-2 RNA is generally detectable in upper and lower  respiratory specimens dur ing the acute phase of infection.  Positive  results are indicative of active infection with SARS-CoV-2.  Clinical  correlation with patient history and other diagnostic information is  necessary to determine patient infection status.  Positive results do  not rule out bacterial infection or co-infection with other viruses. If result is PRESUMPTIVE POSTIVE SARS-CoV-2 nucleic acids MAY BE PRESENT.   A presumptive positive result was obtained on the submitted specimen  and confirmed on repeat testing.  While 2019 novel coronavirus  (SARS-CoV-2) nucleic acids may be present in the submitted sample  additional confirmatory testing may be necessary for epidemiological  and / or clinical management purposes  to differentiate between  SARS-CoV-2 and other Sarbecovirus currently known to infect humans.  If clinically indicated additional testing with an alternate test  methodology [id number]) is advised. The SARS-CoV-2 RNA is generally  detectable in upper and lower respiratory sp ecimens during the acute  phase of infection. The expected result is Negative. Fact Sheet for Patients:  BoilerBrush.com.cy Fact Sheet for Healthcare  Providers: https://pope.com/ This test is not yet approved or cleared by the [place] FDA and has been authorized for detection and/or diagnosis of SARS-CoV-2 by FDA under an Emergency Use Authorization (EUA).  This EUA will remain in effect (meaning this test can be  used) for the duration of the COVID-19 declaration under Section 564(b)(1) of the Act, 21 U.S.C. section 360bbb-3(b)(1), unless the authorization is terminated or revoked sooner. Performed at [facility], [address], [place], [place] [address]   Surgical pcr screen     Status: None   Collection Time: 07/01/18  4:47 PM  Result Value Ref Range Status   MRSA, PCR NEGATIVE NEGATIVE Final   Staphylococcus aureus NEGATIVE NEGATIVE Final    Comment: (NOTE) The Xpert SA Assay (FDA approved for NASAL specimens in patients 34 years of age and older), is one component of a comprehensive surveillance program. It is not intended to diagnose infection nor to guide or monitor treatment. Performed at [facility], [address] [address], [place], [place] [address]   Culture, Urine     Status: Abnormal   Collection Time: 07/02/18 10:25 AM  Result Value Ref Range Status   Specimen Description   Final    URINE, RANDOM Performed at [facility], [address] [place], [place] [address]    Special Requests   Final    NONE Performed at [facility], [address] [place], [place] [address]    Culture (A)  Final    <10,000 COLONIES/mL INSIGNIFICANT GROWTH Performed at [facility], [address], [place], [place] [address]    Report Status 07/03/2018 FINAL  Final     Labs: Basic Metabolic Panel: Recent Labs  Lab 07/01/18 1311 07/02/18 0444 07/03/18 0433 07/04/18 0408  NA 139 138 138 139  K 3.9 4.5 3.8 3.9  CL 105 105 105 105  CO2 GLUCOSE 111* 135* 109* 100*  BUN CREATININE 0.63 0.69  0.65 0.59  CALCIUM 9.1 8.6* 8.2* 8.2*  MG  --   --  1.8  --    Liver Function Tests: No results for input(s): AST, ALT, ALKPHOS, BILITOT, PROT, ALBUMIN in the last 168 hours. No results for input(s): LIPASE, AMYLASE in the last 168 hours. No results for input(s): AMMONIA in the last 168 hours. CBC: Recent Labs  Lab 07/01/18 1311 07/02/18 0444 07/03/18 0433 07/03/18 1614 07/04/18 0408  WBC 9.9 12.1* 8.2  --  6.1  NEUTROABS 8.8*  --  6.6  --  4.6  HGB 12.1 11.1* 9.5* 9.5* 8.8*  HCT 38.1 35.3* 30.8* 31.1* 27.6*  MCV 94.1 95.1 96.9  --  96.5  PLT 199 156 159  --  156   Cardiac Enzymes: No results for input(s): CKTOTAL, CKMB, CKMBINDEX, TROPONINI in the last 168 hours. BNP: BNP (last 3 results) No results for input(s): BNP in the last 8760 hours.  ProBNP (last 3 results) No results for input(s): PROBNP in the last 8760 hours.  CBG: Recent Labs  Lab 07/02/18 1024  GLUCAP 159*       Signed:  Ramiro Harvest MD.  [facility] 07/04/2018, 11:43 AM

## 2018-07-29 ENCOUNTER — Telehealth: Payer: Self-pay | Admitting: Orthopaedic Surgery

## 2018-07-29 NOTE — Telephone Encounter (Signed)
See below

## 2018-07-29 NOTE — Telephone Encounter (Signed)
Patient called and stated that Ninfa Linden did emergency surgery and she needs to ask some questions.  [phone]

## 2018-07-29 NOTE — Telephone Encounter (Signed)
Patient called asked if Dr Ninfa Linden will call her. Patient said she had emergency surgery 4 weeks ago. Patient asked if she can do a follow up via phone with Dr Ninfa Linden. The number to contact patient is [phone]

## 2018-12-24 ENCOUNTER — Other Ambulatory Visit: Payer: Self-pay | Admitting: Specialist

## 2020-10-22 IMAGING — RF DG HIP (WITH OR WITHOUT PELVIS) 1V PORT RIGHT
1 series · 7 of 7 positions shown · non-contrast
Comparison: 07/01/2018

CLINICAL DATA: Right femur fracture

EXAM:
DG HIP (WITH OR WITHOUT PELVIS) 1V PORT RIGHT

[Series 1: unknown protocol · 0.20mm/px · 7 of 7 slices shown]
[im 1/7]
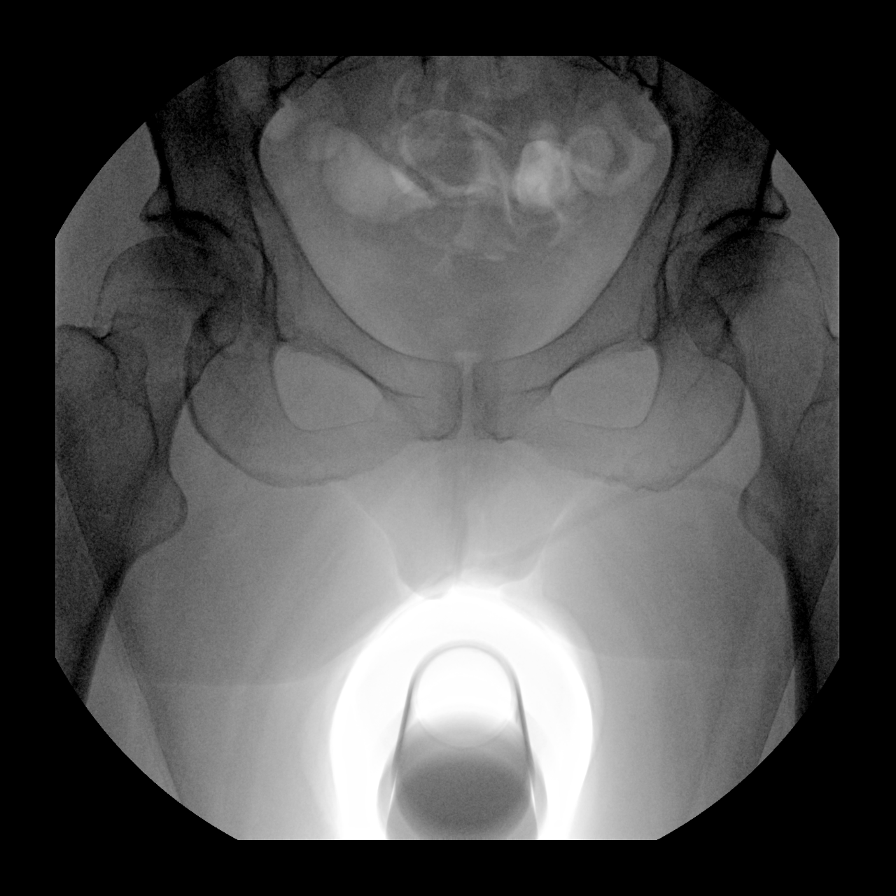
[im 2/7]
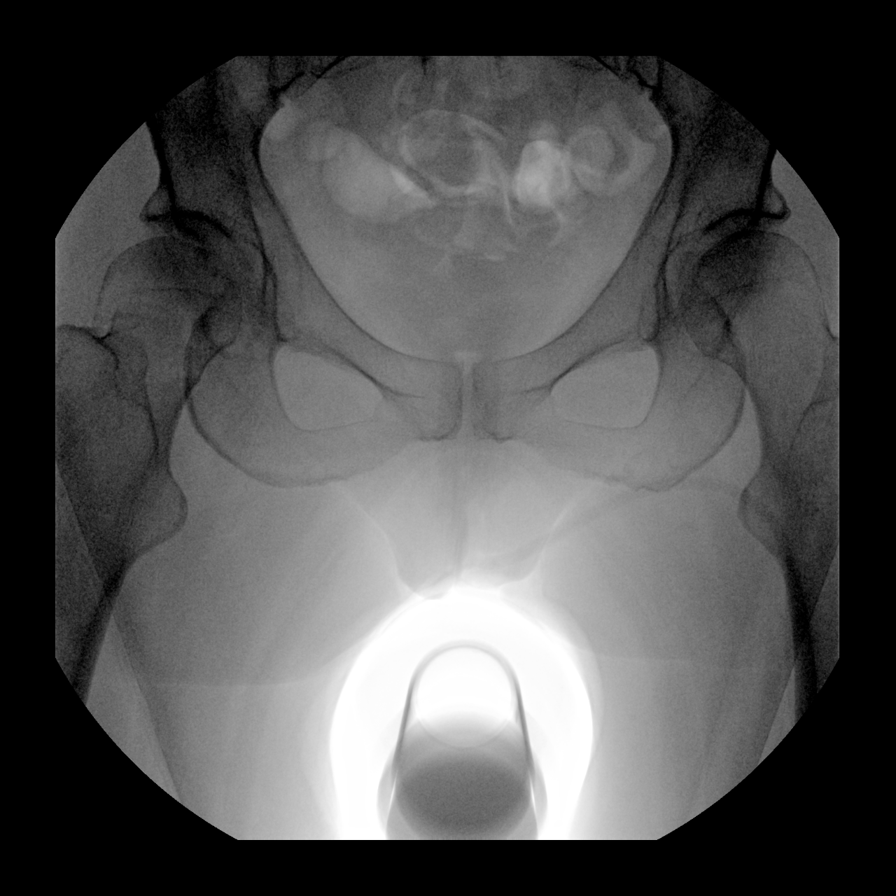
[im 3/7]
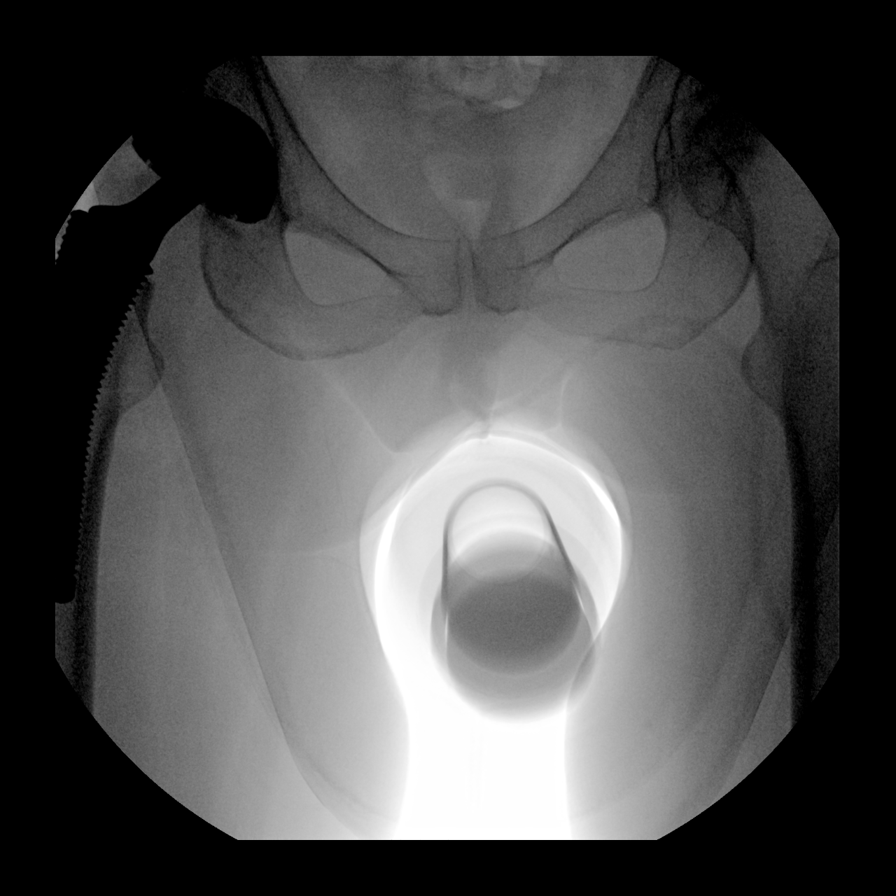
[im 4/7]
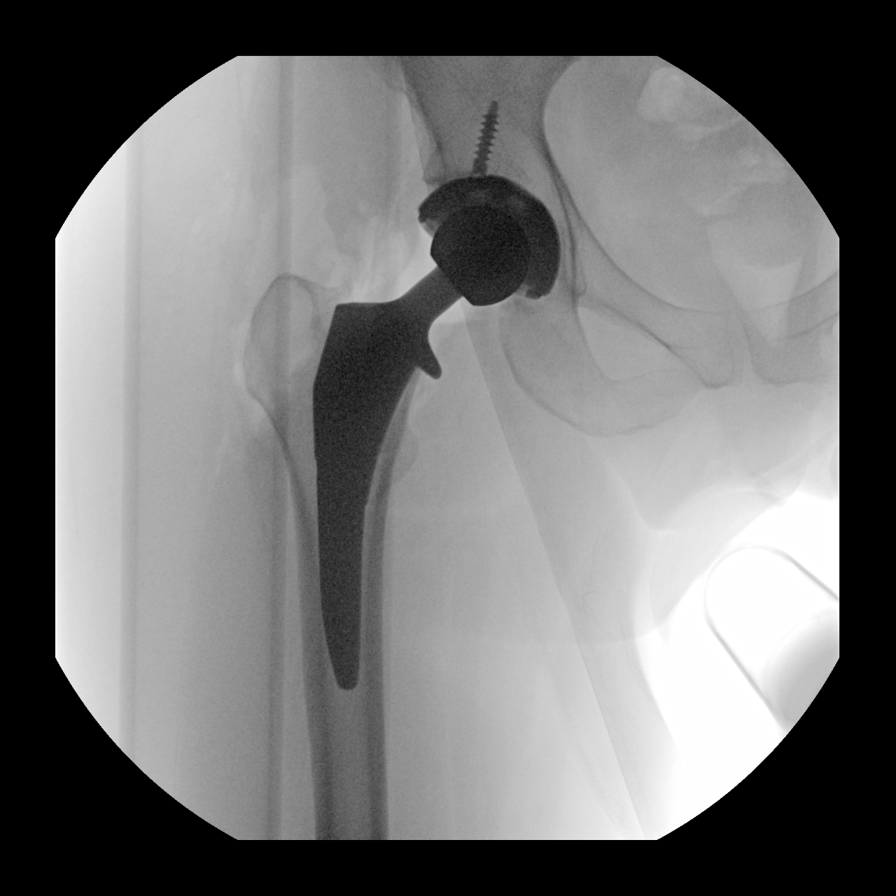
[im 5/7]
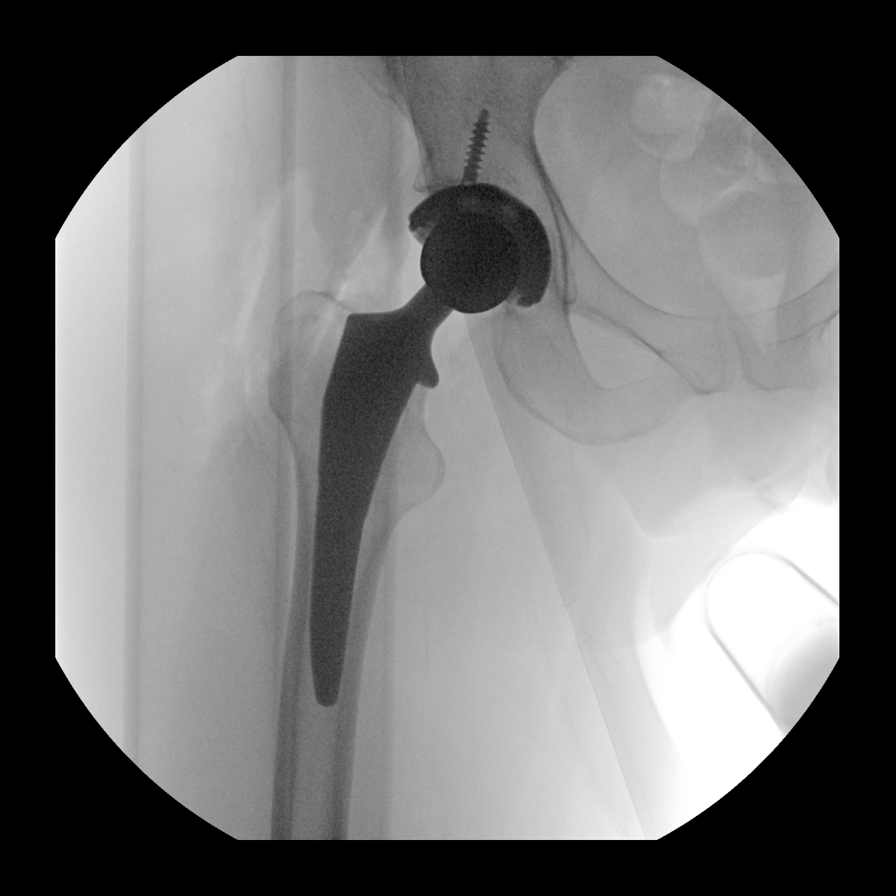
[im 6/7]
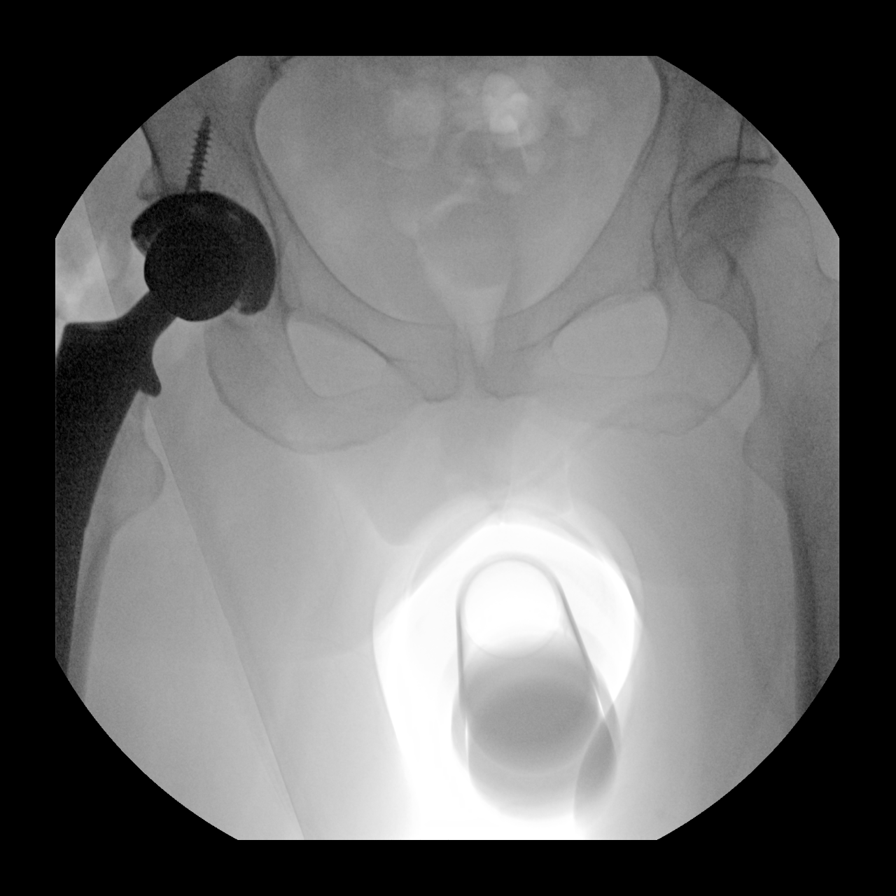
[im 7/7]
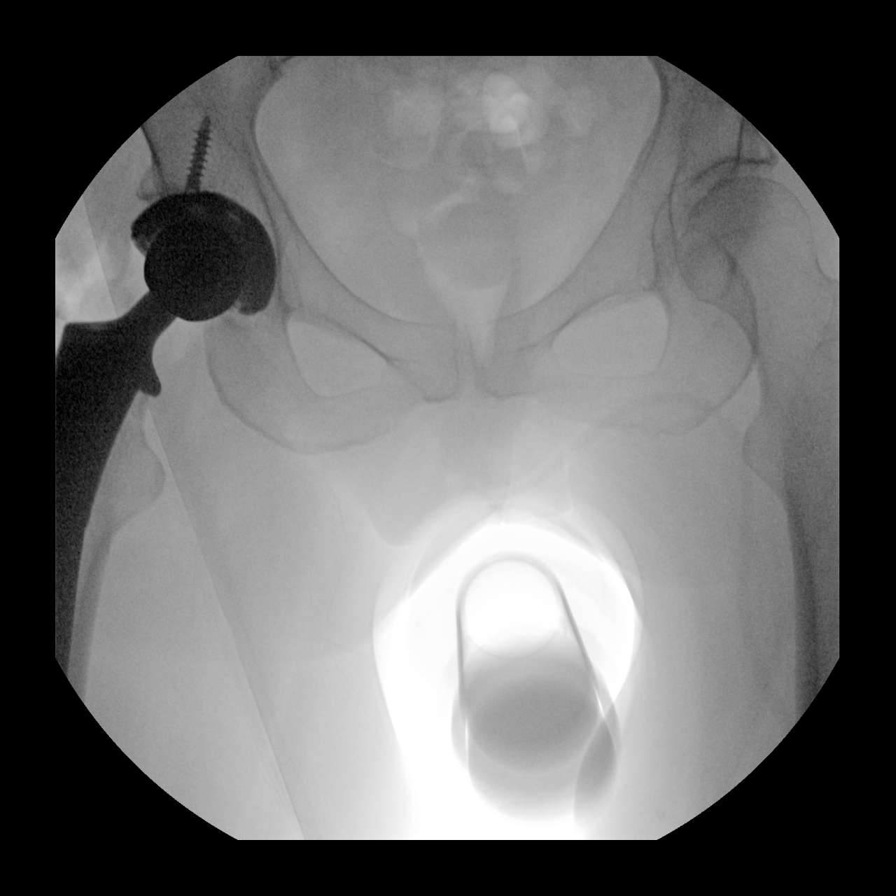

[7 of 7 positions shown; findings below may reference images not displayed]

FINDINGS: Seven low resolution intraoperative spot views of the right hip.
Total fluoroscopy time was 24 seconds. Initial images demonstrate
right femoral neck fracture. Subsequent images demonstrate right hip
replacement with normal alignment.
IMPRESSION: Intraoperative fluoroscopic assistance provided during right hip
replacement for femoral neck fracture
